# Patient Record
Sex: Male | Born: 1950 | Race: White | Hispanic: No | Marital: Single | Smoking: Former smoker
Health system: Southern US, Community
[De-identification: ages and names within clinical notes are randomized; demographics above are authoritative.]

## PROBLEM LIST (undated history)

## (undated) DIAGNOSIS — G43909 Migraine, unspecified, not intractable, without status migrainosus: Secondary | ICD-10-CM

## (undated) DIAGNOSIS — L709 Acne, unspecified: Secondary | ICD-10-CM

## (undated) DIAGNOSIS — J309 Allergic rhinitis, unspecified: Secondary | ICD-10-CM

## (undated) DIAGNOSIS — K259 Gastric ulcer, unspecified as acute or chronic, without hemorrhage or perforation: Secondary | ICD-10-CM

## (undated) DIAGNOSIS — L719 Rosacea, unspecified: Secondary | ICD-10-CM

## (undated) DIAGNOSIS — E119 Type 2 diabetes mellitus without complications: Secondary | ICD-10-CM

## (undated) DIAGNOSIS — K219 Gastro-esophageal reflux disease without esophagitis: Secondary | ICD-10-CM

## (undated) DIAGNOSIS — K859 Acute pancreatitis without necrosis or infection, unspecified: Secondary | ICD-10-CM

## (undated) DIAGNOSIS — K4090 Unilateral inguinal hernia, without obstruction or gangrene, not specified as recurrent: Secondary | ICD-10-CM

## (undated) DIAGNOSIS — F988 Other specified behavioral and emotional disorders with onset usually occurring in childhood and adolescence: Secondary | ICD-10-CM

## (undated) DIAGNOSIS — F1020 Alcohol dependence, uncomplicated: Secondary | ICD-10-CM

## (undated) HISTORY — DX: Alcohol dependence, uncomplicated: F10.20

## (undated) HISTORY — PX: ESOPHAGOGASTRODUODENOSCOPY: SHX1529

## (undated) HISTORY — PX: TONSILLECTOMY: SUR1361

## (undated) HISTORY — DX: Unilateral inguinal hernia, without obstruction or gangrene, not specified as recurrent: K40.90

## (undated) HISTORY — PX: OTHER SURGICAL HISTORY: SHX169

## (undated) HISTORY — DX: Migraine, unspecified, not intractable, without status migrainosus: G43.909

## (undated) HISTORY — DX: Type 2 diabetes mellitus without complications: E11.9

## (undated) HISTORY — DX: Rosacea, unspecified: L71.9

## (undated) HISTORY — DX: Gastro-esophageal reflux disease without esophagitis: K21.9

## (undated) HISTORY — DX: Allergic rhinitis, unspecified: J30.9

## (undated) HISTORY — DX: Acute pancreatitis without necrosis or infection, unspecified: K85.90

## (undated) HISTORY — DX: Other specified behavioral and emotional disorders with onset usually occurring in childhood and adolescence: F98.8

## (undated) HISTORY — DX: Gastric ulcer, unspecified as acute or chronic, without hemorrhage or perforation: K25.9

## (undated) HISTORY — DX: Acne, unspecified: L70.9

---

## 2007-01-31 ENCOUNTER — Ambulatory Visit: Payer: Self-pay | Admitting: Family Medicine

## 2007-07-20 ENCOUNTER — Ambulatory Visit: Payer: Self-pay | Admitting: Family Medicine

## 2008-05-15 ENCOUNTER — Ambulatory Visit: Payer: Self-pay | Admitting: Family Medicine

## 2009-03-07 ENCOUNTER — Ambulatory Visit: Payer: Self-pay | Admitting: Family Medicine

## 2009-12-30 ENCOUNTER — Ambulatory Visit: Payer: Self-pay | Admitting: Family Medicine

## 2011-02-19 ENCOUNTER — Encounter (INDEPENDENT_AMBULATORY_CARE_PROVIDER_SITE_OTHER): Payer: Self-pay | Admitting: Family Medicine

## 2011-02-19 DIAGNOSIS — F988 Other specified behavioral and emotional disorders with onset usually occurring in childhood and adolescence: Secondary | ICD-10-CM

## 2011-02-19 DIAGNOSIS — L708 Other acne: Secondary | ICD-10-CM

## 2011-02-19 DIAGNOSIS — G43909 Migraine, unspecified, not intractable, without status migrainosus: Secondary | ICD-10-CM

## 2011-02-19 DIAGNOSIS — E785 Hyperlipidemia, unspecified: Secondary | ICD-10-CM

## 2011-05-27 ENCOUNTER — Telehealth: Payer: Self-pay | Admitting: Family Medicine

## 2011-05-27 MED ORDER — AMPHETAMINE-DEXTROAMPHETAMINE 30 MG PO TABS: 30.0000 mg | ORAL_TABLET | Freq: Every day | ORAL | Status: DC

## 2011-05-27 NOTE — Telephone Encounter (Signed)
His Adderall was renewed

## 2011-06-04 ENCOUNTER — Telehealth: Payer: Self-pay | Admitting: Medical

## 2011-06-04 ENCOUNTER — Other Ambulatory Visit: Payer: Self-pay | Admitting: Medical

## 2011-06-04 MED ORDER — AMPHETAMINE-DEXTROAMPHETAMINE 30 MG PO TABS: 30.0000 mg | ORAL_TABLET | Freq: Every day | ORAL | Status: DC

## 2011-06-04 NOTE — Telephone Encounter (Signed)
Pt called yesterday for refill, and apparently script didn't print.  Thus, reprinted script today.

## 2011-06-09 ENCOUNTER — Other Ambulatory Visit: Payer: Self-pay | Admitting: Medical

## 2011-06-09 MED ORDER — AMPHETAMINE-DEXTROAMPHETAMINE 30 MG PO TABS: 30.0000 mg | ORAL_TABLET | Freq: Three times a day (TID) | ORAL | Status: DC

## 2011-10-07 ENCOUNTER — Telehealth: Payer: Self-pay | Admitting: Family Medicine

## 2011-10-07 MED ORDER — AMPHETAMINE-DEXTROAMPHETAMINE 30 MG PO TABS: 30.0000 mg | ORAL_TABLET | Freq: Three times a day (TID) | ORAL | Status: DC

## 2011-10-07 NOTE — Telephone Encounter (Signed)
Adderall renewed.

## 2011-10-07 NOTE — Telephone Encounter (Signed)
Call the patient and let him know the prescription is ready

## 2011-10-07 NOTE — Telephone Encounter (Signed)
Called this # [phone] it has been disconected

## 2012-01-11 ENCOUNTER — Telehealth: Payer: Self-pay | Admitting: Family Medicine

## 2012-01-11 MED ORDER — AMPHETAMINE-DEXTROAMPHETAMINE 30 MG PO TABS: 30.0000 mg | ORAL_TABLET | Freq: Three times a day (TID) | ORAL | Status: DC

## 2012-01-11 NOTE — Telephone Encounter (Signed)
Adderall renewed.

## 2012-05-04 ENCOUNTER — Telehealth: Payer: Self-pay | Admitting: Family Medicine

## 2012-05-04 NOTE — Telephone Encounter (Signed)
Needs refill on Adderal 30 mg #300   Please call when ready

## 2012-05-05 ENCOUNTER — Other Ambulatory Visit: Payer: Self-pay | Admitting: Family Medicine

## 2012-05-05 NOTE — Telephone Encounter (Signed)
Left message pt needs med check

## 2012-05-05 NOTE — Telephone Encounter (Signed)
He needs an appointment

## 2012-05-05 NOTE — Telephone Encounter (Signed)
Is this ok?

## 2012-05-09 ENCOUNTER — Ambulatory Visit (INDEPENDENT_AMBULATORY_CARE_PROVIDER_SITE_OTHER): Payer: Self-pay | Admitting: Family Medicine

## 2012-05-09 ENCOUNTER — Encounter: Payer: Self-pay | Admitting: Family Medicine

## 2012-05-09 VITALS — BP 120/70 | HR 91 | Wt 178.0 lb

## 2012-05-09 DIAGNOSIS — F988 Other specified behavioral and emotional disorders with onset usually occurring in childhood and adolescence: Secondary | ICD-10-CM | POA: Insufficient documentation

## 2012-05-09 DIAGNOSIS — F341 Dysthymic disorder: Secondary | ICD-10-CM

## 2012-05-09 DIAGNOSIS — L259 Unspecified contact dermatitis, unspecified cause: Secondary | ICD-10-CM

## 2012-05-09 DIAGNOSIS — L309 Dermatitis, unspecified: Secondary | ICD-10-CM | POA: Insufficient documentation

## 2012-05-09 DIAGNOSIS — G43909 Migraine, unspecified, not intractable, without status migrainosus: Secondary | ICD-10-CM

## 2012-05-09 MED ORDER — PAROXETINE HCL 30 MG PO TABS: 30.0000 mg | ORAL_TABLET | ORAL | Status: DC

## 2012-05-09 MED ORDER — PROMETHAZINE HCL 25 MG PO TABS: 25.0000 mg | ORAL_TABLET | Freq: Three times a day (TID) | ORAL | Status: DC | PRN

## 2012-05-09 MED ORDER — PROPRANOLOL HCL 80 MG PO TABS: 80.0000 mg | ORAL_TABLET | Freq: Two times a day (BID) | ORAL | Status: DC

## 2012-05-09 MED ORDER — AMPHETAMINE-DEXTROAMPHETAMINE 30 MG PO TABS: 30.0000 mg | ORAL_TABLET | Freq: Three times a day (TID) | ORAL | Status: DC

## 2012-05-09 MED ORDER — AMCINONIDE 0.1 % EX CREA: TOPICAL_CREAM | Freq: Two times a day (BID) | CUTANEOUS | Status: DC

## 2012-05-09 NOTE — Progress Notes (Signed)
  Subjective:    Patient ID: Dakota Gray, male    DOB: February 16, 1951, 61 y.o.   MRN: [id number]  HPI He is here for a medication check. He continues to do well on his Adderall. It lasts roughly 4 or 5 hours. Occasionally he will take a half a dose in the evening to keep from interfering with his sleep. He continues on Paxil and states he is using this to help keep him from drinking. He has been sober for 15 years. He has a history of migraine headaches and continues on Adderall for this. He has roughly 2 per year. He is not interested in stopping. He plans a trip to [place] that would like something for nausea from flying. Would also like a refill on his Cortizone cream that he uses for his dermatitis. He did express some concerns over family stress and dealing with his relatives especially his stepmother.  Review of Systems     Objective:   Physical Exam Alert and in no distress otherwise not examined       Assessment & Plan:   1. ADD (attention deficit disorder)   2. Dysthymia   3. Migraine headache   4. Dermatitis    prescriptions were written for his Adderall, Paxil and Inderal. Also Phenergan for the potential nausea and his Cyclocort which renewed.

## 2012-05-11 ENCOUNTER — Telehealth: Payer: Self-pay | Admitting: Family Medicine

## 2012-05-11 MED ORDER — PAROXETINE HCL 40 MG PO TABS: 40.0000 mg | ORAL_TABLET | ORAL | Status: DC

## 2012-05-11 NOTE — Telephone Encounter (Signed)
Incorrect Paxil dosing was called in. The correct 40 mg prescription was called in

## 2012-05-12 ENCOUNTER — Telehealth: Payer: Self-pay | Admitting: Internal Medicine

## 2012-05-12 MED ORDER — PAROXETINE HCL 40 MG PO TABS: 40.0000 mg | ORAL_TABLET | ORAL | Status: DC

## 2012-05-12 NOTE — Telephone Encounter (Signed)
paxil 40mg  [facility] [address]. Redid it and sent to [facility] [address]

## 2012-05-19 ENCOUNTER — Telehealth: Payer: Self-pay | Admitting: Family Medicine

## 2012-05-21 NOTE — Telephone Encounter (Signed)
Call this in and let him know

## 2012-05-23 ENCOUNTER — Other Ambulatory Visit: Payer: Self-pay

## 2012-05-23 NOTE — Telephone Encounter (Signed)
Called med in and pt also

## 2012-08-01 ENCOUNTER — Telehealth: Payer: Self-pay | Admitting: Internal Medicine

## 2012-08-01 MED ORDER — AMPHETAMINE-DEXTROAMPHETAMINE 30 MG PO TABS: 30.0000 mg | ORAL_TABLET | Freq: Three times a day (TID) | ORAL | Status: DC

## 2012-08-01 NOTE — Telephone Encounter (Signed)
Adderall renewed.

## 2012-11-28 ENCOUNTER — Telehealth: Payer: Self-pay | Admitting: Family Medicine

## 2012-11-28 MED ORDER — AMPHETAMINE-DEXTROAMPHETAMINE 30 MG PO TABS: 30.0000 mg | ORAL_TABLET | Freq: Three times a day (TID) | ORAL | Status: DC

## 2012-11-28 NOTE — Telephone Encounter (Signed)
Adderall renewed.

## 2012-11-29 ENCOUNTER — Telehealth: Payer: Self-pay | Admitting: Family Medicine

## 2012-11-29 NOTE — Telephone Encounter (Signed)
LM

## 2013-03-06 ENCOUNTER — Telehealth: Payer: Self-pay | Admitting: Family Medicine

## 2013-03-06 NOTE — Telephone Encounter (Signed)
Left a message that its time for him to schedule an appt so if he would call back and schedule that.

## 2013-03-06 NOTE — Telephone Encounter (Signed)
Tell him it is time for a visit.

## 2013-03-07 ENCOUNTER — Telehealth: Payer: Self-pay | Admitting: Family Medicine

## 2013-03-07 NOTE — Telephone Encounter (Signed)
Patient states that when he came in last May, that we would not have to come in until a year.  He states that when he comes in to pick up Rx he can schedule an appointment for May   Please call when Rx ready

## 2013-03-09 ENCOUNTER — Other Ambulatory Visit: Payer: Self-pay | Admitting: Family Medicine

## 2013-03-09 MED ORDER — AMPHETAMINE-DEXTROAMPHETAMINE 30 MG PO TABS: 30.0000 mg | ORAL_TABLET | Freq: Three times a day (TID) | ORAL | Status: DC

## 2013-03-09 NOTE — Telephone Encounter (Signed)
Review only

## 2013-04-25 ENCOUNTER — Encounter: Payer: Self-pay | Admitting: Internal Medicine

## 2013-05-08 ENCOUNTER — Encounter: Payer: Self-pay | Admitting: Medical

## 2013-05-08 ENCOUNTER — Ambulatory Visit (INDEPENDENT_AMBULATORY_CARE_PROVIDER_SITE_OTHER): Payer: BC Managed Care – PPO | Admitting: Medical

## 2013-05-08 VITALS — BP 150/100 | HR 88 | Temp 98.1°F | Wt 184.0 lb

## 2013-05-08 DIAGNOSIS — F988 Other specified behavioral and emotional disorders with onset usually occurring in childhood and adolescence: Secondary | ICD-10-CM

## 2013-05-08 DIAGNOSIS — K219 Gastro-esophageal reflux disease without esophagitis: Secondary | ICD-10-CM

## 2013-05-08 DIAGNOSIS — Z23 Encounter for immunization: Secondary | ICD-10-CM

## 2013-05-08 DIAGNOSIS — G43909 Migraine, unspecified, not intractable, without status migrainosus: Secondary | ICD-10-CM

## 2013-05-08 DIAGNOSIS — N529 Male erectile dysfunction, unspecified: Secondary | ICD-10-CM

## 2013-05-08 MED ORDER — OMEPRAZOLE 40 MG PO CPDR: 40.0000 mg | DELAYED_RELEASE_CAPSULE | Freq: Every day | ORAL | Status: DC

## 2013-05-08 MED ORDER — PROPRANOLOL HCL 80 MG PO TABS: 80.0000 mg | ORAL_TABLET | Freq: Two times a day (BID) | ORAL | Status: DC

## 2013-05-08 MED ORDER — TADALAFIL 5 MG PO TABS: 5.0000 mg | ORAL_TABLET | Freq: Every day | ORAL | Status: DC | PRN

## 2013-05-08 MED ORDER — PAROXETINE HCL 40 MG PO TABS: 40.0000 mg | ORAL_TABLET | ORAL | Status: DC

## 2013-05-08 NOTE — Addendum Note (Signed)
Addended by: Janeice Robinson on: 05/08/2013 10:11 AM   Modules accepted: Orders

## 2013-05-08 NOTE — Progress Notes (Signed)
Subjective:  Dakota Gray is a 62 y.o. male who presents for "yearly follow up."  Needs refills on medications.  Normally sees Dr. Susann Givens about once yearly.  Last visit here was 04/2012.   This is my first visit with him today.    He would like a prescription for Zantac 300mg .  Currently taking 150mg  once daily.  He notes hx/o ulcer in the past, 1980s.   He reports heartburn daily, sometimes nausea, sometimes reflux feeling in throat.  He notes hx/o EGD and prior study for Prilosec.    Wants something stronger than 150mg  zantac.   Of note, no prior colonoscopy.   Using Paxil daily.  Paxil keeps him from having desire to drink alcohol, "keeps him sober."  Takes Adderall 3 times daily.  In the past has had therapy.  Usually gets quantity for #300 at a time for 90 day supply.  Apparently there has been pharmacy issues in the past with quantities.  This works ok for him.    Exercises daily, but not eating healthy, no diet discretion.   Ate a whole carton of breyers ice cream the other day.  Lives on 3rd floor, up and down stairs all the time.    Wants refill on Cialis.  Has coupon for the 30 day free trial.   Has been on this in the past.    He declines labs, EKG, and other evaluation today.  Says he doesn't go looking for trouble with testing.  No other aggravating or relieving factors.    No other c/o.  The following portions of the patient's history were reviewed and updated as appropriate: allergies, current medications, past family history, past medical history, past social history, past surgical history and problem list.  ROS Otherwise as in subjective above  Past Medical History  Diagnosis Date  . ADD (attention deficit disorder)   . Acne   . Alcoholic   . Migraine headache   . Allergic rhinitis   . Rosacea     Objective: Physical Exam  Vital signs reviewed  General appearance: alert, no distress, WD/WN Neck: supple, no lymphadenopathy, no thyromegaly, no masses Heart:  RRR, normal S1, S2, no murmurs Lungs: CTA bilaterally, no wheezes, rhonchi, or rales Abdomen: +bs, soft, non tender, non distended, no masses, no hepatomegaly, no splenomegaly Pulses: 2+ radial pulses, 2+ pedal pulses, normal cap refill Ext: no edema   Assessment: Encounter Diagnoses  Name Primary?  . ADD (attention deficit disorder) Yes  . Migraine headache   . GERD (gastroesophageal reflux disease)   . Erectile dysfunction   . Need for Tdap vaccination      Plan: I advised he return soon for fasting physical with Dr. Susann Givens.   Advised baseline EKG as well.   He declines these today.    ADD - i advised that I can't refill 90 day supply of stimulants as a mid level provider, thus, he declines 30 day script today, will f/u with Dr. Susann Givens for scripts.   Migraine - does well on Inderal daily for preventative.  GERD - stop zantac, begin Omeprazole, avoid spicy and acidi foods, caffeine, recheck 72mo.   ED - refilled Cialis.  Recheck 72mo.  tdap vaccine, counseling and VIS given today.  Follow up: 72mo with Dr. Susann Givens.

## 2013-06-01 ENCOUNTER — Encounter: Payer: Self-pay | Admitting: Family Medicine

## 2013-06-04 ENCOUNTER — Telehealth: Payer: Self-pay | Admitting: Internal Medicine

## 2013-06-04 MED ORDER — OMEPRAZOLE 40 MG PO CPDR: 40.0000 mg | DELAYED_RELEASE_CAPSULE | Freq: Every day | ORAL | Status: DC

## 2013-06-04 NOTE — Telephone Encounter (Signed)
done

## 2013-06-05 ENCOUNTER — Encounter: Payer: Self-pay | Admitting: Family Medicine

## 2013-06-25 ENCOUNTER — Other Ambulatory Visit: Payer: Self-pay | Admitting: Family Medicine

## 2013-06-26 MED ORDER — AMPHETAMINE-DEXTROAMPHETAMINE 30 MG PO TABS: 30.0000 mg | ORAL_TABLET | Freq: Three times a day (TID) | ORAL | Status: DC

## 2013-07-08 ENCOUNTER — Other Ambulatory Visit: Payer: Self-pay | Admitting: Family Medicine

## 2013-10-08 ENCOUNTER — Other Ambulatory Visit: Payer: Self-pay | Admitting: Family Medicine

## 2013-10-09 ENCOUNTER — Other Ambulatory Visit: Payer: Self-pay | Admitting: Family Medicine

## 2013-10-09 MED ORDER — AMPHETAMINE-DEXTROAMPHETAMINE 30 MG PO TABS: 30.0000 mg | ORAL_TABLET | Freq: Three times a day (TID) | ORAL | Status: DC

## 2013-10-09 MED ORDER — PROMETHAZINE HCL 25 MG PO TABS: 25.0000 mg | ORAL_TABLET | Freq: Three times a day (TID) | ORAL | Status: DC | PRN

## 2013-10-10 MED ORDER — PROMETHAZINE HCL 25 MG PO TABS: 25.0000 mg | ORAL_TABLET | Freq: Three times a day (TID) | ORAL | Status: DC | PRN

## 2013-10-10 MED ORDER — AMPHETAMINE-DEXTROAMPHETAMINE 30 MG PO TABS: 30.0000 mg | ORAL_TABLET | Freq: Three times a day (TID) | ORAL | Status: DC

## 2013-10-11 ENCOUNTER — Other Ambulatory Visit (INDEPENDENT_AMBULATORY_CARE_PROVIDER_SITE_OTHER): Payer: BC Managed Care – PPO

## 2013-10-11 DIAGNOSIS — Z23 Encounter for immunization: Secondary | ICD-10-CM

## 2013-10-17 ENCOUNTER — Other Ambulatory Visit: Payer: Self-pay

## 2013-12-06 ENCOUNTER — Telehealth: Payer: Self-pay | Admitting: Internal Medicine

## 2013-12-06 ENCOUNTER — Other Ambulatory Visit: Payer: Self-pay

## 2013-12-06 MED ORDER — OMEPRAZOLE 40 MG PO CPDR: 40.0000 mg | DELAYED_RELEASE_CAPSULE | Freq: Every day | ORAL | Status: DC

## 2013-12-06 NOTE — Telephone Encounter (Signed)
DONE

## 2013-12-06 NOTE — Telephone Encounter (Signed)
SENT OMEPRAZOLE IN

## 2013-12-06 NOTE — Telephone Encounter (Signed)
Refill request for omeprazole 40mg  #100 to [facility] order pharmacy in [place], [place]

## 2013-12-27 HISTORY — PX: HEMORRHOID BANDING: SHX5850

## 2014-02-11 ENCOUNTER — Encounter: Payer: Self-pay | Admitting: Family Medicine

## 2014-02-11 ENCOUNTER — Telehealth: Payer: Self-pay | Admitting: Family Medicine

## 2014-02-11 MED ORDER — AMPHETAMINE-DEXTROAMPHETAMINE 30 MG PO TABS: 30.0000 mg | ORAL_TABLET | Freq: Three times a day (TID) | ORAL | Status: DC

## 2014-02-11 NOTE — Telephone Encounter (Signed)
lm

## 2014-03-20 ENCOUNTER — Encounter: Payer: Self-pay | Admitting: Family Medicine

## 2014-03-27 ENCOUNTER — Ambulatory Visit (INDEPENDENT_AMBULATORY_CARE_PROVIDER_SITE_OTHER): Payer: BC Managed Care – PPO | Admitting: Family Medicine

## 2014-03-27 ENCOUNTER — Encounter: Payer: Self-pay | Admitting: Family Medicine

## 2014-03-27 VITALS — BP 100/80 | HR 88 | Wt 176.0 lb

## 2014-03-27 DIAGNOSIS — F988 Other specified behavioral and emotional disorders with onset usually occurring in childhood and adolescence: Secondary | ICD-10-CM

## 2014-03-27 DIAGNOSIS — G43909 Migraine, unspecified, not intractable, without status migrainosus: Secondary | ICD-10-CM

## 2014-03-27 DIAGNOSIS — Z79899 Other long term (current) drug therapy: Secondary | ICD-10-CM

## 2014-03-27 DIAGNOSIS — F341 Dysthymic disorder: Secondary | ICD-10-CM

## 2014-03-27 DIAGNOSIS — R159 Full incontinence of feces: Secondary | ICD-10-CM

## 2014-03-27 LAB — COMPREHENSIVE METABOLIC PANEL
ALT: 21 U/L (ref 0–53)
AST: 25 U/L (ref 0–37)
Albumin: 4.8 g/dL (ref 3.5–5.2)
Alkaline Phosphatase: 71 U/L (ref 39–117)
BILIRUBIN TOTAL: 0.7 mg/dL (ref 0.2–1.2)
BUN: 15 mg/dL (ref 6–23)
CALCIUM: 9.5 mg/dL (ref 8.4–10.5)
CHLORIDE: 99 meq/L (ref 96–112)
CO2: 30 meq/L (ref 19–32)
CREATININE: 1.08 mg/dL (ref 0.50–1.35)
GLUCOSE: 97 mg/dL (ref 70–99)
Potassium: 5 mEq/L (ref 3.5–5.3)
Sodium: 136 mEq/L (ref 135–145)
TOTAL PROTEIN: 6.8 g/dL (ref 6.0–8.3)

## 2014-03-27 LAB — LIPID PANEL
Cholesterol: 234 mg/dL — ABNORMAL HIGH (ref 0–200)
HDL: 44 mg/dL (ref >39)
LDL Cholesterol: 160 mg/dL — ABNORMAL HIGH (ref 0–99)
Total CHOL/HDL Ratio: 5.3 Ratio
Triglycerides: 149 mg/dL (ref <150)
VLDL: 30 mg/dL (ref 0–40)

## 2014-03-27 LAB — CBC WITH DIFFERENTIAL/PLATELET
Basophils Absolute: 0 10*3/uL (ref 0.0–0.1)
Basophils Relative: 0 % (ref 0–1)
EOS ABS: 0.1 10*3/uL (ref 0.0–0.7)
EOS PCT: 2 % (ref 0–5)
HEMATOCRIT: 44.3 % (ref 39.0–52.0)
HEMOGLOBIN: 16.1 g/dL (ref 13.0–17.0)
LYMPHS PCT: 38 % (ref 12–46)
Lymphs Abs: 2.7 10*3/uL (ref 0.7–4.0)
MCH: 29.8 pg (ref 26.0–34.0)
MCHC: 36.3 g/dL — ABNORMAL HIGH (ref 30.0–36.0)
MCV: 82 fL (ref 78.0–100.0)
MONOS PCT: 10 % (ref 3–12)
Monocytes Absolute: 0.7 10*3/uL (ref 0.1–1.0)
Neutro Abs: 3.5 10*3/uL (ref 1.7–7.7)
Neutrophils Relative %: 50 % (ref 43–77)
Platelets: 232 10*3/uL (ref 150–400)
RBC: 5.4 MIL/uL (ref 4.22–5.81)
RDW: 12.9 % (ref 11.5–15.5)
WBC: 7 10*3/uL (ref 4.0–10.5)

## 2014-03-27 NOTE — Progress Notes (Signed)
   Subjective:    Patient ID: Dakota Gray, Dakota Gray    DOB: 1951-08-24, 63 y.o.   MRN: [id number]  HPI He is here for consult concerning bowel habits. Over the last 25 years he has had 6 episodes of losing control of his bowels. Usually they occur while sleeping. He did have one episode of soiling while awake He has never had any soiling during the day other than that one episode. No bladder issues. This is the first time he has mentioned this to any physician. He is not interested in spending a lot of money as he has a high deductible. He continues on Paxil for underlying dysthymia. His Adderall is working well for his ADD. Inderal is controlling his migraines. He is in the process of trying to find a job. He is been living off of his investments for several years but apparently this is about to run out. This has been stressed. He continues to have sleep issues but is comfortable with his present sleeping regimen.   Review of Systems     Objective:   Physical Exam alert and in no distress. Tympanic membranes and canals are normal. Throat is clear. Tonsils are normal. Neck is supple without adenopathy or thyromegaly. Cardiac exam shows a regular sinus rhythm without murmurs or gallops. Lungs are clear to auscultation. Abdominal exam shows no masses or tenderness. Rectal exam deferred to GI        Assessment & Plan:  Incontinence of bowel - Plan: CBC with Differential, Comprehensive metabolic panel, Ambulatory referral to Gastroenterology  ADD (attention deficit disorder)  Dysthymia - Plan: CBC with Differential, Comprehensive metabolic panel  Migraine headache - Plan: CBC with Differential, Comprehensive metabolic panel  Encounter for long-term (current) use of other medications - Plan: CBC with Differential, Comprehensive metabolic panel, Lipid panel  I explained that bowel incontinence issue although rare is out of my area of expertise and he would need referral. I told him I had no clue  as to what it would cost for this. He will continue on his other medications.

## 2014-04-07 ENCOUNTER — Encounter: Payer: Self-pay | Admitting: Family Medicine

## 2014-04-11 ENCOUNTER — Encounter: Payer: Self-pay | Admitting: Family Medicine

## 2014-04-11 ENCOUNTER — Encounter: Payer: Self-pay | Admitting: Internal Medicine

## 2014-05-08 ENCOUNTER — Encounter: Payer: Self-pay | Admitting: Family Medicine

## 2014-05-30 ENCOUNTER — Ambulatory Visit (INDEPENDENT_AMBULATORY_CARE_PROVIDER_SITE_OTHER): Payer: BC Managed Care – PPO | Admitting: Internal Medicine

## 2014-05-30 ENCOUNTER — Encounter: Payer: Self-pay | Admitting: Internal Medicine

## 2014-05-30 VITALS — BP 138/90 | HR 88 | Ht 68.0 in | Wt 175.0 lb

## 2014-05-30 DIAGNOSIS — Z1211 Encounter for screening for malignant neoplasm of colon: Secondary | ICD-10-CM

## 2014-05-30 DIAGNOSIS — K648 Other hemorrhoids: Secondary | ICD-10-CM

## 2014-05-30 DIAGNOSIS — K4090 Unilateral inguinal hernia, without obstruction or gangrene, not specified as recurrent: Secondary | ICD-10-CM

## 2014-05-30 NOTE — Patient Instructions (Signed)
HEMORRHOID BANDING PROCEDURE    FOLLOW-UP CARE   1. The procedure you have had should have been relatively painless since the banding of the area involved does not have nerve endings and there is no pain sensation.  The rubber band cuts off the blood supply to the hemorrhoid and the band may fall off as soon as 48 hours after the banding (the band may occasionally be seen in the toilet bowl following a bowel movement). You may notice a temporary feeling of fullness in the rectum which should respond adequately to plain Tylenol or Motrin.  2. Following the banding, avoid strenuous exercise that evening and resume full activity the next day.  A sitz bath (soaking in a warm tub) or bidet is soothing, and can be useful for cleansing the area after bowel movements.     3. To avoid constipation, take two tablespoons of natural wheat bran, natural oat bran, flax, Benefiber or any over the counter fiber supplement and increase your water intake to 7-8 glasses daily.    4. Unless you have been prescribed anorectal medication, do not put anything inside your rectum for two weeks: No suppositories, enemas, fingers, etc.  5. Occasionally, you may have more bleeding than usual after the banding procedure.  This is often from the untreated hemorrhoids rather than the treated one.  Don't be concerned if there is a tablespoon or so of blood.  If there is more blood than this, lie flat with your bottom higher than your head and apply an ice pack to the area. If the bleeding does not stop within a half an hour or if you feel faint, call our office at [phone] or go to the emergency room.  6. Problems are not common; however, if there is a substantial amount of bleeding, severe pain, chills, fever or difficulty passing urine (very rare) or other problems, you should call us at [phone] or report to the nearest emergency room.  7. Do not stay seated continuously for more than 2-3 hours for a day or two  after the procedure.  Tighten your buttock muscles 10-15 times every two hours and take 10-15 deep breaths every 1-2 hours.  Do not spend more than a few minutes on the toilet if you cannot empty your bowel; instead re-visit the toilet at a later time.  Call back for future bandings.   We will get more information on a colonoscopy and call you back.   I appreciate the opportunity to care for you.

## 2014-05-30 NOTE — Progress Notes (Signed)
Subjective:    Patient ID: Dakota Gray, male    DOB: 1951/08/30, 63 y.o.   MRN: [id number]  HPI Is a 63 year old man here to discuss: Cancer screening, intermittent fecal soilage, and what he thinks is a hernia.  He is a fairly constant urge to defecate or sensation of that any weak small amounts of stool and was underwear. Over the past few years he's had several episodes of nocturnal incontinence as well. No urinary difficulties. He does note he has hemorrhoids and has seen some bright red blood with wiping at times. Sometimes things are [place] sore there are but there is no sharp or severe pain or pain with defecation. He has not necessarily tried any over-the-counter medications.  He also has a bulge in the right inguinal region and wonders if he has a hernia.  He knows colonoscopy for colon cancer screening is appropriate but says he has a very high did not the bowl insurance plan and cannot afford it because he retired at age 11 and because of the stock market crash in 2008 is running out of money and is unable to find work at this time. Allergies  Allergen Reactions  . Other     Artificial Sweeteners (Splenda)   Outpatient Prescriptions Prior to Visit  Medication Sig Dispense Refill  . amcinonide (CYCLOCORT) 0.1 % cream Apply topically 2 (two) times daily.  30 g  1  . amphetamine-dextroamphetamine (ADDERALL) 30 MG tablet Take 1 tablet (30 mg total) by mouth 3 (three) times daily.  300 tablet  0  . omeprazole (PRILOSEC) 40 MG capsule Take 1 capsule (40 mg total) by mouth daily.  100 capsule  1  . promethazine (PHENERGAN) 25 MG tablet Take 1 tablet (25 mg total) by mouth every 8 (eight) hours as needed for nausea.  12 tablet  0  . propranolol (INDERAL) 80 MG tablet Take 1 tablet (80 mg total) by mouth 2 (two) times daily.  200 tablet  3  . tadalafil (CIALIS) 5 MG tablet Take 1 tablet (5 mg total) by mouth daily as needed for erectile dysfunction.  30 tablet  0  . promethazine  (PHENERGAN) 25 MG tablet Take 1 tablet (25 mg total) by mouth every 8 (eight) hours as needed for nausea.  12 tablet  0   No facility-administered medications prior to visit.   Past Medical History  Diagnosis Date  . ADD (attention deficit disorder)   . Acne   . Alcoholic   . Migraine headache   . Allergic rhinitis   . Rosacea   . GERD (gastroesophageal reflux disease)   . Pancreatitis    Past Surgical History  Procedure Laterality Date  . Esophagogastroduodenoscopy      with duodenal ulcer repair  . Tonsillectomy    . Boil extraction      several   History   Social History  . Marital Status: Single    Spouse Name: N/A    Number of Children: 0  . Years of Education: N/A   Occupational History  . retired    Social History Main Topics  . Smoking status: Former Smoker    Types: Cigarettes    Quit date: 10/05/1984  . Smokeless tobacco: Never Used  . Alcohol Use: No  . Drug Use: No  . Sexual Activity: None   Other Topics Concern  . None   Social History Narrative  . None   Family History  Problem Relation Age  of Onset  . Prostate cancer Father   . Lung cancer Paternal Uncle     smoker        Review of Systems As above, also some anxiety and fatigue and allergies. All other review of systems negative or as per history of present illness.    Objective:   Physical Exam General:  Well-developed, well-nourished and in no acute distress Eyes:  anicteric. ENT:   Mouth and posterior pharynx free of lesions.  Neck:   supple w/o thyromegaly or mass.  Lungs: Clear to auscultation bilaterally. Heart:  S1S2, no rubs, murmurs, gallops. Abdomen:  soft, non-tender, no hepatosplenomegaly, or mass and BS+. There is a right inguinal hernia that approaches but does not seem to enter the scrotum, it is reducible. No left-sided hernia. Rectal:  Patti [place] CMA present   Anoderm inspection revealed no abnormalities Anal wink was absent Digital exam revealed normal  resting tone and voluntary squeeze. No mass or rectocele present. Simulated defecation with valsalva revealed appropriate abdominal contraction and descent.    Anoscopy was performed with the patient in the left lateral decubitus position while a chaperone was present and revealed grade 2-3 internal hemorrhoids in all positions   Lymph:  no cervical or supraclavicular adenopathy. Extremities:   no edema Skin   no rash. Neuro:  A&O x 3.  Psych:  appropriate mood and  Affect.   PROCEDURE NOTE: The patient presents with symptomatic grade 2-3 hemorrhoids, requesting rubber band ligation of his/her hemorrhoidal disease.  All risks, benefits and alternative forms of therapy were described and informed consent was obtained.    The decision was made to band the RP and LL internal hemorrhoids, and the [facility] O'Regan System was used to perform band ligation without complication.  Digital anorectal examination was then performed to assure proper positioning of the band, and to adjust the banded tissue as required.  The patient was discharged home without pain or other issues.  Dietary and behavioral recommendations were given and along with follow-up instructions.      The patient will return in if desired for  follow-up and possible additional banding as required. No complications were encountered and the patient tolerated the procedure well.    Lab Results  Component Value Date   WBC 7.0 03/27/2014   HGB 16.1 03/27/2014   HCT 44.3 03/27/2014   MCV 82.0 03/27/2014   PLT 232 03/27/2014     Chemistry      Component Value Date/Time   NA 136 03/27/2014 1419   K 5.0 03/27/2014 1419   CL 99 03/27/2014 1419   CO2 30 03/27/2014 1419   BUN 15 03/27/2014 1419   CREATININE 1.08 03/27/2014 1419      Component Value Date/Time   CALCIUM 9.5 03/27/2014 1419   ALKPHOS 71 03/27/2014 1419   AST 25 03/27/2014 1419   ALT 21 03/27/2014 1419   BILITOT 0.7 03/27/2014 1419          Assessment & Plan:   1. Right inguinal  hernia   2. Internal hemorrhoids with bleeding, prolapse and fecal soiling   3. Special screening for malignant neoplasms, colon    Internal hemorrhoids with bleeding, prolapse and fecal soiling RP and LL columns banded. Is not sure he wants to return due to cost of this procedure.  Right inguinal hernia I discussed with the signs and symptoms of incarceration or and the need to present to the emergency department at that occurred. He is not interested in pursuing  surgical referral at this time.   Screening colonoscopy is recommended. We looked in to cost for him, I thought if he were insured then screening colonoscopy should be no copay or deductible. When we checked it looks like he did not have active insurance. I will notify him and he will decide about following up with this recommendation.  CC: Carollee Herter, MD

## 2014-05-31 ENCOUNTER — Encounter: Payer: Self-pay | Admitting: Internal Medicine

## 2014-05-31 DIAGNOSIS — K4090 Unilateral inguinal hernia, without obstruction or gangrene, not specified as recurrent: Secondary | ICD-10-CM | POA: Insufficient documentation

## 2014-05-31 DIAGNOSIS — K648 Other hemorrhoids: Secondary | ICD-10-CM | POA: Insufficient documentation

## 2014-05-31 NOTE — Assessment & Plan Note (Signed)
RP and LL columns banded. Is not sure he wants to return due to cost of this procedure.

## 2014-05-31 NOTE — Assessment & Plan Note (Signed)
I discussed with the signs and symptoms of incarceration or and the need to present to the emergency department at that occurred. He is not interested in pursuing surgical referral at this time.

## 2014-06-02 ENCOUNTER — Encounter: Payer: Self-pay | Admitting: Internal Medicine

## 2014-06-02 ENCOUNTER — Encounter: Payer: Self-pay | Admitting: Family Medicine

## 2014-06-03 ENCOUNTER — Telehealth: Payer: Self-pay | Admitting: Family Medicine

## 2014-06-03 ENCOUNTER — Other Ambulatory Visit: Payer: Self-pay | Admitting: Family Medicine

## 2014-06-03 MED ORDER — AMPHETAMINE-DEXTROAMPHETAMINE 30 MG PO TABS: 30.0000 mg | ORAL_TABLET | Freq: Three times a day (TID) | ORAL | Status: DC

## 2014-06-03 NOTE — Telephone Encounter (Signed)
lm

## 2014-06-11 ENCOUNTER — Encounter: Payer: Self-pay | Admitting: Internal Medicine

## 2014-06-11 ENCOUNTER — Encounter: Payer: Self-pay | Admitting: Family Medicine

## 2014-06-12 ENCOUNTER — Encounter: Payer: Self-pay | Admitting: Family Medicine

## 2014-07-26 ENCOUNTER — Other Ambulatory Visit: Payer: Self-pay | Admitting: Medical

## 2014-07-29 NOTE — Telephone Encounter (Signed)
Is this okay to refill? 

## 2014-08-10 ENCOUNTER — Other Ambulatory Visit: Payer: Self-pay | Admitting: Family Medicine

## 2014-08-12 ENCOUNTER — Encounter: Payer: Self-pay | Admitting: Internal Medicine

## 2014-08-12 ENCOUNTER — Ambulatory Visit (INDEPENDENT_AMBULATORY_CARE_PROVIDER_SITE_OTHER): Payer: BC Managed Care – PPO | Admitting: Internal Medicine

## 2014-08-12 VITALS — BP 110/66 | HR 68 | Ht 68.0 in | Wt 177.4 lb

## 2014-08-12 DIAGNOSIS — K648 Other hemorrhoids: Secondary | ICD-10-CM

## 2014-08-12 DIAGNOSIS — Z1211 Encounter for screening for malignant neoplasm of colon: Secondary | ICD-10-CM

## 2014-08-12 MED ORDER — POLYETHYLENE GLYCOL 3350 17 GM/SCOOP PO POWD: 1.0000 | Freq: Every day | ORAL | Status: DC

## 2014-08-12 NOTE — Patient Instructions (Addendum)
HEMORRHOID BANDING PROCEDURE    FOLLOW-UP CARE   1. The procedure you have had should have been relatively painless since the banding of the area involved does not have nerve endings and there is no pain sensation.  The rubber band cuts off the blood supply to the hemorrhoid and the band may fall off as soon as 48 hours after the banding (the band may occasionally be seen in the toilet bowl following a bowel movement). You may notice a temporary feeling of fullness in the rectum which should respond adequately to plain Tylenol or Motrin.  2. Following the banding, avoid strenuous exercise that evening and resume full activity the next day.  A sitz bath (soaking in a warm tub) or bidet is soothing, and can be useful for cleansing the area after bowel movements.     3. To avoid constipation, take two tablespoons of natural wheat bran, natural oat bran, flax, Benefiber or any over the counter fiber supplement and increase your water intake to 7-8 glasses daily.    4. Unless you have been prescribed anorectal medication, do not put anything inside your rectum for two weeks: No suppositories, enemas, fingers, etc.  5. Occasionally, you may have more bleeding than usual after the banding procedure.  This is often from the untreated hemorrhoids rather than the treated one.  Don't be concerned if there is a tablespoon or so of blood.  If there is more blood than this, lie flat with your bottom higher than your head and apply an ice pack to the area. If the bleeding does not stop within a half an hour or if you feel faint, call our office at [phone] or go to the emergency room.  6. Problems are not common; however, if there is a substantial amount of bleeding, severe pain, chills, fever or difficulty passing urine (very rare) or other problems, you should call us at [phone] or report to the nearest emergency room.  7. Do not stay seated continuously for more than 2-3 hours for a day or two  after the procedure.  Tighten your buttock muscles 10-15 times every two hours and take 10-15 deep breaths every 1-2 hours.  Do not spend more than a few minutes on the toilet if you cannot empty your bowel; instead re-visit the toilet at a later time.   You have been scheduled for a colonoscopy. Please follow written instructions given to you at your visit today.  Please pick up your prep supplies at the pharmacy. If you use inhalers (even only as needed), please bring them with you on the day of your procedure. Your physician has requested that you go to www.[email or web address] and enter the access code given to you at your visit today. This web site gives a general overview about your procedure. However, you should still follow specific instructions given to you by our office regarding your preparation for the procedure.  I appreciate the opportunity to care for you.

## 2014-08-12 NOTE — Progress Notes (Signed)
Patient ID: Dakota Gray, male   DOB: June 11, 1951, 63 y.o.   MRN: [id number]        PROCEDURE NOTE: The patient presents with symptomatic grade 2-3  hemorrhoids, requesting rubber band ligation of his/her hemorrhoidal disease.  All risks, benefits and alternative forms of therapy were described and informed consent was obtained.   The anorectum was pre-medicated with 5% lidocaine cream and 0.125% NTG ointment. The decision was made to band the RA internal hemorrhoid, and the [facility] O'Regan System was used to perform band ligation without complication.  Digital anorectal examination was then performed to assure proper positioning of the band, and to adjust the banded tissue as required.  The patient was discharged home without pain or other issues.  Dietary and behavioral recommendations were given and along with follow-up instructions.     The following adjunctive treatments were recommended:  Fiber supplements  The patient will return for screening colonoscopy and we will reassess then.    He was asking if I would Rx oxycodone prn Headache and trazadone qhs - I advised he should seek headache specialist for evaluation and treatment.

## 2014-08-12 NOTE — Assessment & Plan Note (Addendum)
RA column banded today

## 2014-08-12 NOTE — Telephone Encounter (Signed)
Dr.lalonde is this okay his last appointment 4/15

## 2014-08-13 ENCOUNTER — Other Ambulatory Visit: Payer: Self-pay | Admitting: Family Medicine

## 2014-08-15 ENCOUNTER — Encounter: Payer: Self-pay | Admitting: Internal Medicine

## 2014-08-22 ENCOUNTER — Encounter: Payer: Self-pay | Admitting: Family Medicine

## 2014-08-24 ENCOUNTER — Other Ambulatory Visit: Payer: Self-pay | Admitting: Family Medicine

## 2014-08-24 MED ORDER — PAROXETINE HCL 40 MG PO TABS: ORAL_TABLET | ORAL | Status: DC

## 2014-08-26 ENCOUNTER — Other Ambulatory Visit: Payer: Self-pay | Admitting: Family Medicine

## 2014-08-26 MED ORDER — PAROXETINE HCL 40 MG PO TABS: ORAL_TABLET | ORAL | Status: DC

## 2014-09-18 ENCOUNTER — Other Ambulatory Visit: Payer: Self-pay | Admitting: Family Medicine

## 2014-09-18 MED ORDER — AMPHETAMINE-DEXTROAMPHETAMINE 30 MG PO TABS: 30.0000 mg | ORAL_TABLET | Freq: Three times a day (TID) | ORAL | Status: DC

## 2014-09-19 ENCOUNTER — Telehealth: Payer: Self-pay | Admitting: Family Medicine

## 2014-09-19 NOTE — Telephone Encounter (Signed)
Called pt reached vm rx ready to pick up

## 2014-10-16 ENCOUNTER — Encounter: Payer: BC Managed Care – PPO | Admitting: Internal Medicine

## 2014-11-18 ENCOUNTER — Encounter: Payer: Self-pay | Admitting: Internal Medicine

## 2014-11-25 ENCOUNTER — Other Ambulatory Visit: Payer: Self-pay | Admitting: Family Medicine

## 2014-11-26 NOTE — Telephone Encounter (Signed)
Dr.Lalonde is this okay 

## 2015-01-14 ENCOUNTER — Telehealth: Payer: Self-pay | Admitting: Family Medicine

## 2015-01-14 ENCOUNTER — Encounter: Payer: Self-pay | Admitting: Internal Medicine

## 2015-01-14 ENCOUNTER — Other Ambulatory Visit: Payer: Self-pay | Admitting: Family Medicine

## 2015-01-14 MED ORDER — AMPHETAMINE-DEXTROAMPHETAMINE 30 MG PO TABS: 30.0000 mg | ORAL_TABLET | Freq: Three times a day (TID) | ORAL | Status: DC

## 2015-01-14 NOTE — Telephone Encounter (Signed)
Left message to inform rx is ready.

## 2015-03-11 ENCOUNTER — Telehealth: Payer: Self-pay | Admitting: Family Medicine

## 2015-03-11 ENCOUNTER — Encounter: Payer: Self-pay | Admitting: Family Medicine

## 2015-03-11 ENCOUNTER — Other Ambulatory Visit: Payer: Self-pay | Admitting: Family Medicine

## 2015-03-11 DIAGNOSIS — K4090 Unilateral inguinal hernia, without obstruction or gangrene, not specified as recurrent: Secondary | ICD-10-CM

## 2015-03-11 MED ORDER — AMPHETAMINE-DEXTROAMPHET ER 30 MG PO CP24: 30.0000 mg | ORAL_CAPSULE | ORAL | Status: DC

## 2015-03-11 NOTE — Progress Notes (Signed)
He is apparently having difficulty getting the regular Adderall 30 mg. I will given the Adderall XR 30. He will be seen by me in a few days.

## 2015-03-11 NOTE — Telephone Encounter (Signed)
L/m for pt. rx is ready for pick up

## 2015-03-14 ENCOUNTER — Telehealth: Payer: Self-pay | Admitting: Family Medicine

## 2015-03-14 NOTE — Telephone Encounter (Signed)
Recv'd denial for generic, only covers BRAND Adderall XR unless pt has tried and failed.  Called pharmacy and went thru ins for $40 co pay. Left message for pt.

## 2015-03-18 ENCOUNTER — Ambulatory Visit (INDEPENDENT_AMBULATORY_CARE_PROVIDER_SITE_OTHER): Payer: 59 | Admitting: Family Medicine

## 2015-03-18 VITALS — BP 120/82 | HR 89 | Wt 178.2 lb

## 2015-03-18 DIAGNOSIS — F909 Attention-deficit hyperactivity disorder, unspecified type: Secondary | ICD-10-CM

## 2015-03-18 DIAGNOSIS — R631 Polydipsia: Secondary | ICD-10-CM | POA: Diagnosis not present

## 2015-03-18 DIAGNOSIS — F988 Other specified behavioral and emotional disorders with onset usually occurring in childhood and adolescence: Secondary | ICD-10-CM

## 2015-03-18 DIAGNOSIS — R61 Generalized hyperhidrosis: Secondary | ICD-10-CM

## 2015-03-18 LAB — POCT URINALYSIS DIPSTICK
Bilirubin, UA: NEGATIVE
Blood, UA: NEGATIVE
GLUCOSE UA: NEGATIVE
Ketones, UA: NEGATIVE
Leukocytes, UA: NEGATIVE
Nitrite, UA: NEGATIVE
Protein, UA: NEGATIVE
SPEC GRAV UA: 1.025
Urobilinogen, UA: NEGATIVE
pH, UA: 6

## 2015-03-18 MED ORDER — AMPHETAMINE-DEXTROAMPHETAMINE 20 MG PO TABS: ORAL_TABLET | ORAL | Status: DC

## 2015-03-18 NOTE — Progress Notes (Signed)
   Subjective:    Patient ID: Dakota Gray, male    DOB: Jan 14, 1951, 64 y.o.   MRN: [id number]  HPI He is here for multiple issues. He has had difficulty in the past with internal hemorrhoids and did have banding. He was sent there initially because of difficulty with soiling. He states that the banding of the hemorrhoids is really not helped. He then when on to explain that he has had 2 separate incidents of soiling while laying in bed at night. He also had one incident of swelling after urinating. He states that after that he decided to always sit down to urinate to lunate any possibility of problems. These occurred over the last 6-8 months. He feels as if the other procedure was not worthwhile. He also stated that he is always thirsty. He urinates several times during the day and also at night. He has had difficulty with night sweats for several years. He continues on medications listed in the chart including Paxil. He has had difficulty with his ADD medication. Apparently the 30 mg pills are not in stock. He did try Adderall XR early milligrams but states it only lasted 6 hours. He then when on to explain that on occasion he would "pass out" roughly 6 hours after taking his Adderall XR. He also complains of occasional difficulty with the left second third and fourth toes becoming quite red cold and painful. This could last for several hours or even less. It did not occur every day.   Review of Systems     Objective:   Physical Exam Alert and in no distress. Tympanic membranes and canals are normal. Pharyngeal area is normal. Neck is supple without adenopathy or thyromegaly. Cardiac exam shows a regular sinus rhythm without murmurs or gallops. Lungs are clear to auscultation. Exam of his toes shows normal sensation and skin with good pulses.       Assessment & Plan:  ADD (attention deficit disorder)  Always thirsty - Plan: POCT urinalysis dipstick  Night sweats Over 45 minutes spent  discussing all these issues with him. He was very difficult to get a good history from him as he tended to give obtuse answers. I did recommend that he keep track of his soiling and see if this reoccurs or if we might possibly need to refer to a different GI doctor since he was not happy with the one he is originally seen. I will give him Adderall 20 mg to be taken one and 3:30 times per day which is roughly what he was taking before. Also this will be covered with less money out of his pocket. Explained that he is drinking adequate amounts of water but can certainly drink more if he likes. No therapy for the night sweats as this is probably related to his Paxil.

## 2015-03-27 ENCOUNTER — Encounter: Payer: Self-pay | Admitting: Family Medicine

## 2015-03-28 ENCOUNTER — Telehealth: Payer: Self-pay | Admitting: Family Medicine

## 2015-03-28 NOTE — Telephone Encounter (Signed)
Called [facility] Generic Adderall 20mg  1 1/2 tabs qd #135/30 approved.  But when test claim was ran it rejected due to refill too soon.  Called pharmacy help desk at # [phone] and explained what happened & they did over-ride.  Called pharmacy & it went thru for $10

## 2015-04-20 ENCOUNTER — Encounter: Payer: Self-pay | Admitting: Family Medicine

## 2015-04-22 ENCOUNTER — Other Ambulatory Visit: Payer: Self-pay | Admitting: Family Medicine

## 2015-04-22 MED ORDER — AMPHETAMINE-DEXTROAMPHETAMINE 30 MG PO TABS: 30.0000 mg | ORAL_TABLET | Freq: Every day | ORAL | Status: DC

## 2015-04-22 MED ORDER — AMPHETAMINE-DEXTROAMPHETAMINE 30 MG PO TABS: 30.0000 mg | ORAL_TABLET | Freq: Three times a day (TID) | ORAL | Status: DC

## 2015-04-22 NOTE — Progress Notes (Signed)
Prescriptions were written for 3 months. A small prescription for 15 tablets was given. He would like to try a different brand name

## 2015-04-23 ENCOUNTER — Encounter: Payer: Self-pay | Admitting: Internal Medicine

## 2015-04-27 ENCOUNTER — Encounter: Payer: Self-pay | Admitting: Family Medicine

## 2015-04-28 ENCOUNTER — Other Ambulatory Visit: Payer: Self-pay | Admitting: Family Medicine

## 2015-05-12 ENCOUNTER — Telehealth: Payer: Self-pay | Admitting: Family Medicine

## 2015-05-12 NOTE — Telephone Encounter (Signed)
Left message for pt to call. Per JCL needs an appt.  °

## 2015-05-17 ENCOUNTER — Encounter: Payer: Self-pay | Admitting: Family Medicine

## 2015-05-20 ENCOUNTER — Encounter: Payer: Self-pay | Admitting: Family Medicine

## 2015-05-29 ENCOUNTER — Telehealth: Payer: Self-pay | Admitting: Family Medicine

## 2015-05-29 NOTE — Telephone Encounter (Signed)
Please go ahead with referral for patient rt inquinal hernia repair per Dr. Susann GivensLalonde

## 2015-06-02 NOTE — Telephone Encounter (Signed)
I have sent a referral to [facility] [phone] fax [phone]

## 2015-06-12 ENCOUNTER — Telehealth: Payer: Self-pay | Admitting: Internal Medicine

## 2015-06-12 ENCOUNTER — Other Ambulatory Visit: Payer: Self-pay

## 2015-06-12 MED ORDER — PROPRANOLOL HCL 80 MG PO TABS: ORAL_TABLET | ORAL | Status: DC

## 2015-06-12 NOTE — Telephone Encounter (Signed)
CALLED IN 

## 2015-06-12 NOTE — Telephone Encounter (Signed)
Refill request for propranolol 80mg  #180 1 BID to [facility] [address]

## 2015-08-14 ENCOUNTER — Other Ambulatory Visit: Payer: Self-pay | Admitting: Family Medicine

## 2015-08-14 ENCOUNTER — Encounter: Payer: Self-pay | Admitting: Family Medicine

## 2015-08-14 ENCOUNTER — Telehealth: Payer: Self-pay

## 2015-08-14 ENCOUNTER — Encounter: Payer: Self-pay | Admitting: Internal Medicine

## 2015-08-14 DIAGNOSIS — Z79899 Other long term (current) drug therapy: Secondary | ICD-10-CM

## 2015-08-14 DIAGNOSIS — G43009 Migraine without aura, not intractable, without status migrainosus: Secondary | ICD-10-CM

## 2015-08-14 DIAGNOSIS — F341 Dysthymic disorder: Secondary | ICD-10-CM

## 2015-08-14 DIAGNOSIS — F988 Other specified behavioral and emotional disorders with onset usually occurring in childhood and adolescence: Secondary | ICD-10-CM

## 2015-08-14 MED ORDER — AMPHETAMINE-DEXTROAMPHETAMINE 30 MG PO TABS: 30.0000 mg | ORAL_TABLET | Freq: Three times a day (TID) | ORAL | Status: DC

## 2015-08-14 MED ORDER — PROPRANOLOL HCL 80 MG PO TABS: ORAL_TABLET | ORAL | Status: DC

## 2015-08-14 NOTE — Telephone Encounter (Signed)
Dakota Gray with [facility] called and stated pt also needs a refill of propranolol 80 mg.

## 2015-08-14 NOTE — Telephone Encounter (Signed)
Left a message for pt. To pickup his Adderall prescriptions.

## 2015-08-14 NOTE — Telephone Encounter (Signed)
Is this okay?

## 2015-08-14 NOTE — Addendum Note (Signed)
Addended by: Ronnald Nian on: 08/14/2015 03:18 PM   Modules accepted: Orders

## 2015-10-12 ENCOUNTER — Encounter: Payer: Self-pay | Admitting: Family Medicine

## 2015-10-14 ENCOUNTER — Encounter: Payer: Self-pay | Admitting: Family Medicine

## 2015-10-15 ENCOUNTER — Ambulatory Visit: Payer: 59 | Admitting: Family Medicine

## 2015-10-16 ENCOUNTER — Other Ambulatory Visit: Payer: Self-pay

## 2015-10-16 ENCOUNTER — Ambulatory Visit (INDEPENDENT_AMBULATORY_CARE_PROVIDER_SITE_OTHER): Payer: 59 | Admitting: Family Medicine

## 2015-10-16 ENCOUNTER — Encounter: Payer: Self-pay | Admitting: Family Medicine

## 2015-10-16 VITALS — BP 116/78 | HR 84 | Ht 69.0 in | Wt 179.0 lb

## 2015-10-16 DIAGNOSIS — F988 Other specified behavioral and emotional disorders with onset usually occurring in childhood and adolescence: Secondary | ICD-10-CM

## 2015-10-16 DIAGNOSIS — M542 Cervicalgia: Secondary | ICD-10-CM | POA: Diagnosis not present

## 2015-10-16 DIAGNOSIS — Z23 Encounter for immunization: Secondary | ICD-10-CM | POA: Diagnosis not present

## 2015-10-16 DIAGNOSIS — G43001 Migraine without aura, not intractable, with status migrainosus: Secondary | ICD-10-CM | POA: Diagnosis not present

## 2015-10-16 DIAGNOSIS — E875 Hyperkalemia: Secondary | ICD-10-CM

## 2015-10-16 DIAGNOSIS — Z79899 Other long term (current) drug therapy: Secondary | ICD-10-CM | POA: Diagnosis not present

## 2015-10-16 DIAGNOSIS — Z1159 Encounter for screening for other viral diseases: Secondary | ICD-10-CM | POA: Diagnosis not present

## 2015-10-16 DIAGNOSIS — E785 Hyperlipidemia, unspecified: Secondary | ICD-10-CM | POA: Diagnosis not present

## 2015-10-16 DIAGNOSIS — F909 Attention-deficit hyperactivity disorder, unspecified type: Secondary | ICD-10-CM

## 2015-10-16 DIAGNOSIS — F341 Dysthymic disorder: Secondary | ICD-10-CM | POA: Diagnosis not present

## 2015-10-16 LAB — CBC WITH DIFFERENTIAL/PLATELET
Basophils Absolute: 0.1 10*3/uL (ref 0.0–0.1)
Basophils Relative: 1 % (ref 0–1)
Eosinophils Absolute: 0.1 10*3/uL (ref 0.0–0.7)
Eosinophils Relative: 2 % (ref 0–5)
HEMATOCRIT: 44.6 % (ref 39.0–52.0)
HEMOGLOBIN: 15.3 g/dL (ref 13.0–17.0)
LYMPHS ABS: 2.2 10*3/uL (ref 0.7–4.0)
LYMPHS PCT: 33 % (ref 12–46)
MCH: 29.7 pg (ref 26.0–34.0)
MCHC: 34.3 g/dL (ref 30.0–36.0)
MCV: 86.6 fL (ref 78.0–100.0)
MONOS PCT: 10 % (ref 3–12)
MPV: 9.6 fL (ref 8.6–12.4)
Monocytes Absolute: 0.7 10*3/uL (ref 0.1–1.0)
NEUTROS ABS: 3.6 10*3/uL (ref 1.7–7.7)
NEUTROS PCT: 54 % (ref 43–77)
PLATELETS: 237 10*3/uL (ref 150–400)
RBC: 5.15 MIL/uL (ref 4.22–5.81)
RDW: 12.8 % (ref 11.5–15.5)
WBC: 6.6 10*3/uL (ref 4.0–10.5)

## 2015-10-16 LAB — LIPID PANEL
CHOL/HDL RATIO: 6 ratio — AB (ref <=5.0)
CHOLESTEROL: 221 mg/dL — AB (ref 125–200)
HDL: 37 mg/dL — ABNORMAL LOW (ref >=40)
LDL Cholesterol: 148 mg/dL — ABNORMAL HIGH (ref <130)
TRIGLYCERIDES: 182 mg/dL — AB (ref <150)
VLDL: 36 mg/dL — ABNORMAL HIGH (ref <30)

## 2015-10-16 LAB — COMPREHENSIVE METABOLIC PANEL
ALBUMIN: 4.5 g/dL (ref 3.6–5.1)
ALK PHOS: 74 U/L (ref 40–115)
ALT: 16 U/L (ref 9–46)
AST: 19 U/L (ref 10–35)
BUN: 14 mg/dL (ref 7–25)
CALCIUM: 9.9 mg/dL (ref 8.6–10.3)
CO2: 28 mmol/L (ref 20–31)
Chloride: 102 mmol/L (ref 98–110)
Creat: 0.92 mg/dL (ref 0.70–1.25)
Glucose, Bld: 103 mg/dL — ABNORMAL HIGH (ref 65–99)
POTASSIUM: 5.5 mmol/L — AB (ref 3.5–5.3)
Sodium: 138 mmol/L (ref 135–146)
TOTAL PROTEIN: 7 g/dL (ref 6.1–8.1)
Total Bilirubin: 0.6 mg/dL (ref 0.2–1.2)

## 2015-10-16 MED ORDER — AMPHETAMINE-DEXTROAMPHETAMINE 30 MG PO TABS: 30.0000 mg | ORAL_TABLET | Freq: Three times a day (TID) | ORAL | Status: DC

## 2015-10-16 MED ORDER — PROMETHAZINE HCL 25 MG PO TABS: 25.0000 mg | ORAL_TABLET | Freq: Three times a day (TID) | ORAL | Status: DC | PRN

## 2015-10-16 NOTE — Progress Notes (Signed)
   Subjective:    Patient ID: Dakota Gray, male    DOB: 07/15/1951, 64 y.o.   MRN: [id number]  HPI He is here for an interval evaluation. He has concerns over his immunizations. He also has concerns over getting a colonoscopy which he has not had.he is doing well on his present ADD medication. He takes it 3 times a day which helps him stay focused and on task. He would like to continue on this regimen. He has been stable on this for quite some time. He also uses Phenergan intermittently for his migraines and also if he travels he does get nauseated easily. He also complains of a one-month history of right-sided neck pain that did radiate into the shoulder. The pain is made worse with rotation. He has been taking heat and stretching as well as 800 mg of ibuprofen per day but no improvement in his symptoms. No numbness, tingling or weakness other than he says in his fingers. Psychologically he is stable and has no particular concerns. Review of his record does show some evidence of elevated lipids. Otherwise he has no particular concerns or complaints.   Review of Systems     Objective:   Physical Exam Alert and in no distress. Limited range of motion in all directions with his neck. Normal motor, sensory and DTRs of his arms.      Assessment & Plan:  Need for prophylactic vaccination and inoculation against influenza - Plan: Flu Vaccine QUAD 36+ mos IM  Hyperlipidemia LDL goal <130 - Plan: Lipid panel  ADD (attention deficit disorder) - Plan: amphetamine-dextroamphetamine (ADDERALL) 30 MG tablet, amphetamine-dextroamphetamine (ADDERALL) 30 MG tablet, amphetamine-dextroamphetamine (ADDERALL) 30 MG tablet  Neck pain - Plan: CBC with Differential/Platelet, Comprehensive metabolic panel, DG Cervical Spine Complete  Migraine without aura and with status migrainosus, not intractable - Plan: CBC with Differential/Platelet, Comprehensive metabolic panel, promethazine (PHENERGAN) 25 MG  tablet  Dysthymia - Plan: CBC with Differential/Platelet, Comprehensive metabolic panel  Need for hepatitis C screening test - Plan: Hepatitis C Antibody, CANCELED: Hepatitis C Antibody  Encounter for long-term (current) use of medications - Plan: CBC with Differential/Platelet, Comprehensive metabolic panel, Lipid panel I will do routine blood screening for general purposes and also to look at his lipids as well as hepatitis C since he is in the age range. His migraines are not giving him much difficulty. Psychologically again he is doing well. Discussed the neck pain with him and if x-rays are negative, we'll send him for physical therapy. His Adderall was renewed as well as his Phenergan.

## 2015-10-17 LAB — HEPATITIS C ANTIBODY: HCV Ab: NEGATIVE

## 2015-10-17 NOTE — Addendum Note (Signed)
Addended by: Ronnald NianLALONDE, Brandley Aldrete C on: 10/17/2015 10:10 AM   Modules accepted: Orders

## 2015-10-18 ENCOUNTER — Encounter: Payer: Self-pay | Admitting: Family Medicine

## 2015-10-20 ENCOUNTER — Ambulatory Visit
Admission: RE | Admit: 2015-10-20 | Discharge: 2015-10-20 | Disposition: A | Discharge status: Home / Self Care | Payer: 59 | Source: Ambulatory Visit | Attending: Family Medicine | Admitting: Family Medicine

## 2015-10-20 DIAGNOSIS — M542 Cervicalgia: Secondary | ICD-10-CM

## 2015-10-21 ENCOUNTER — Other Ambulatory Visit: Payer: Self-pay

## 2015-10-21 DIAGNOSIS — M542 Cervicalgia: Secondary | ICD-10-CM

## 2015-10-23 ENCOUNTER — Encounter: Payer: Self-pay | Admitting: Family Medicine

## 2015-10-29 ENCOUNTER — Other Ambulatory Visit: Payer: Self-pay | Admitting: Family Medicine

## 2015-10-29 ENCOUNTER — Encounter: Payer: Self-pay | Admitting: Family Medicine

## 2015-10-29 MED ORDER — SUMATRIPTAN-NAPROXEN SODIUM 85-500 MG PO TABS: 1.0000 | ORAL_TABLET | ORAL | Status: DC | PRN

## 2015-10-31 ENCOUNTER — Encounter: Payer: Self-pay | Admitting: Family Medicine

## 2015-11-11 ENCOUNTER — Encounter: Payer: Self-pay | Admitting: Family Medicine

## 2015-11-12 ENCOUNTER — Other Ambulatory Visit: Payer: Self-pay | Admitting: Family Medicine

## 2015-11-12 MED ORDER — SUMATRIPTAN SUCCINATE 100 MG PO TABS
100.0000 mg | ORAL_TABLET | Freq: Once | ORAL | Status: DC
Start: 2015-11-12 — End: 2017-11-24

## 2015-11-24 ENCOUNTER — Telehealth: Payer: Self-pay | Admitting: Family Medicine

## 2015-11-24 NOTE — Telephone Encounter (Signed)
P.A. TREXIMET

## 2015-11-25 ENCOUNTER — Encounter: Payer: Self-pay | Admitting: Family Medicine

## 2015-11-25 ENCOUNTER — Other Ambulatory Visit: Payer: Self-pay | Admitting: Family Medicine

## 2015-11-25 MED ORDER — VARDENAFIL HCL 20 MG PO TABS: 20.0000 mg | ORAL_TABLET | Freq: Every day | ORAL | Status: DC | PRN

## 2015-12-09 NOTE — Telephone Encounter (Signed)
P.A. Colleen Canreximet denied, pt needs trial of ALL of the following Naratriptan, Rizatriptan, Sumatriptan, Zolmitriptan, all used in combination with naproxen Do you want to switch

## 2015-12-25 ENCOUNTER — Telehealth: Payer: Self-pay | Admitting: Family Medicine

## 2015-12-25 NOTE — Telephone Encounter (Signed)
P.A. LEVITRA

## 2015-12-25 NOTE — Telephone Encounter (Signed)
Pt was switched

## 2015-12-25 NOTE — Telephone Encounter (Signed)
P.A. Denied, called insurance company and ED meds are plan exclusion Called pt & advised option of Pt Assistance or pay out of pocket with [facility] in [place].  He is going to apply for Medicaid  And also Pt Assistance

## 2016-01-01 ENCOUNTER — Encounter: Payer: Self-pay | Admitting: Family Medicine

## 2016-01-01 ENCOUNTER — Other Ambulatory Visit: Payer: Self-pay | Admitting: Family Medicine

## 2016-02-15 ENCOUNTER — Other Ambulatory Visit: Payer: Self-pay | Admitting: Family Medicine

## 2016-02-16 ENCOUNTER — Encounter: Payer: Self-pay | Admitting: Family Medicine

## 2016-02-16 ENCOUNTER — Other Ambulatory Visit: Payer: Self-pay | Admitting: Family Medicine

## 2016-02-16 DIAGNOSIS — F988 Other specified behavioral and emotional disorders with onset usually occurring in childhood and adolescence: Secondary | ICD-10-CM

## 2016-02-16 MED ORDER — AMPHETAMINE-DEXTROAMPHETAMINE 30 MG PO TABS: 30.0000 mg | ORAL_TABLET | Freq: Three times a day (TID) | ORAL | Status: DC

## 2016-05-13 ENCOUNTER — Other Ambulatory Visit: Payer: Self-pay | Admitting: Family Medicine

## 2016-05-13 DIAGNOSIS — F988 Other specified behavioral and emotional disorders with onset usually occurring in childhood and adolescence: Secondary | ICD-10-CM

## 2016-05-13 MED ORDER — AMPHETAMINE-DEXTROAMPHETAMINE 30 MG PO TABS: 30.0000 mg | ORAL_TABLET | Freq: Three times a day (TID) | ORAL | Status: DC

## 2016-05-13 NOTE — Addendum Note (Signed)
Addended by: Ronnald NianLALONDE, Pranish Akhavan C on: 05/13/2016 08:23 AM   Modules accepted: Orders

## 2016-05-20 ENCOUNTER — Other Ambulatory Visit: Payer: Self-pay

## 2016-05-20 MED ORDER — IBUPROFEN 800 MG PO TABS: 800.0000 mg | ORAL_TABLET | Freq: Three times a day (TID) | ORAL | Status: DC | PRN

## 2016-05-20 NOTE — Telephone Encounter (Signed)
[facility] per [employer]

## 2016-05-21 ENCOUNTER — Telehealth: Payer: Self-pay

## 2016-05-21 MED ORDER — PAROXETINE HCL 40 MG PO TABS: 40.0000 mg | ORAL_TABLET | Freq: Every morning | ORAL | Status: DC

## 2016-05-21 NOTE — Telephone Encounter (Signed)
Fax rcvd requesting 90 day [facility] Trixie Rude/RLB

## 2016-05-30 ENCOUNTER — Encounter: Payer: Self-pay | Admitting: Family Medicine

## 2016-06-11 ENCOUNTER — Telehealth: Payer: Self-pay

## 2016-06-11 NOTE — Telephone Encounter (Signed)
Records from 12/27/2014 to [place] [facility] [facility] [facility] [place]. Trixie Rude/RLB

## 2016-08-16 ENCOUNTER — Other Ambulatory Visit: Payer: Self-pay | Admitting: Family Medicine

## 2016-08-16 ENCOUNTER — Encounter: Payer: Self-pay | Admitting: Family Medicine

## 2016-08-16 DIAGNOSIS — F988 Other specified behavioral and emotional disorders with onset usually occurring in childhood and adolescence: Secondary | ICD-10-CM

## 2016-08-16 MED ORDER — AMPHETAMINE-DEXTROAMPHETAMINE 30 MG PO TABS: 30.0000 mg | ORAL_TABLET | Freq: Three times a day (TID) | ORAL | 0 refills | Status: DC

## 2016-08-23 ENCOUNTER — Telehealth: Payer: Self-pay | Admitting: Family Medicine

## 2016-08-23 NOTE — Telephone Encounter (Signed)
Pt came in and dropped off some papers to be reviewed. Sending back.

## 2016-08-26 ENCOUNTER — Encounter: Payer: Self-pay | Admitting: Family Medicine

## 2016-11-26 ENCOUNTER — Encounter: Payer: Self-pay | Admitting: Family Medicine

## 2016-11-26 ENCOUNTER — Other Ambulatory Visit: Payer: Self-pay | Admitting: Family Medicine

## 2016-11-26 DIAGNOSIS — F988 Other specified behavioral and emotional disorders with onset usually occurring in childhood and adolescence: Secondary | ICD-10-CM

## 2016-11-26 DIAGNOSIS — G43001 Migraine without aura, not intractable, with status migrainosus: Secondary | ICD-10-CM

## 2016-11-28 MED ORDER — AMPHETAMINE-DEXTROAMPHETAMINE 30 MG PO TABS: 30.0000 mg | ORAL_TABLET | Freq: Three times a day (TID) | ORAL | 0 refills | Status: DC

## 2016-11-28 MED ORDER — PROMETHAZINE HCL 25 MG PO TABS: 25.0000 mg | ORAL_TABLET | Freq: Three times a day (TID) | ORAL | 0 refills | Status: DC | PRN

## 2016-11-29 ENCOUNTER — Telehealth: Payer: Self-pay

## 2016-11-29 NOTE — Telephone Encounter (Signed)
Mailed adderall RX called in phenergan per [employer]

## 2016-12-02 ENCOUNTER — Encounter: Payer: Self-pay | Admitting: Family Medicine

## 2016-12-13 ENCOUNTER — Other Ambulatory Visit: Payer: Self-pay | Admitting: Family Medicine

## 2016-12-13 NOTE — Telephone Encounter (Signed)
Is this okay to refill? 

## 2017-01-03 ENCOUNTER — Telehealth: Payer: Self-pay | Admitting: Family Medicine

## 2017-01-03 NOTE — Telephone Encounter (Signed)
P.A. DEX-AMPHETAMINE SALT COMBO

## 2017-02-04 ENCOUNTER — Other Ambulatory Visit: Payer: Self-pay | Admitting: Family Medicine

## 2017-02-04 DIAGNOSIS — F988 Other specified behavioral and emotional disorders with onset usually occurring in childhood and adolescence: Secondary | ICD-10-CM

## 2017-02-08 ENCOUNTER — Encounter: Payer: Self-pay | Admitting: Family Medicine

## 2017-02-08 ENCOUNTER — Telehealth: Payer: Self-pay | Admitting: Family Medicine

## 2017-02-08 DIAGNOSIS — F988 Other specified behavioral and emotional disorders with onset usually occurring in childhood and adolescence: Secondary | ICD-10-CM

## 2017-02-08 MED ORDER — AMPHETAMINE-DEXTROAMPHETAMINE 30 MG PO TABS: 30.0000 mg | ORAL_TABLET | Freq: Three times a day (TID) | ORAL | 0 refills | Status: DC

## 2017-02-08 NOTE — Telephone Encounter (Signed)
Let him know that I wrote the prescription

## 2017-02-08 NOTE — Telephone Encounter (Signed)
Left VM Rx has been written

## 2017-02-08 NOTE — Telephone Encounter (Signed)
Pt said to let you know he made an appt for Monday and it would be ok if you release his rx sooner.

## 2017-02-08 NOTE — Addendum Note (Signed)
Addended by: Ronnald NianLALONDE, Catheryne Deford C on: 02/08/2017 04:27 PM   Modules accepted: Orders

## 2017-02-14 ENCOUNTER — Ambulatory Visit (INDEPENDENT_AMBULATORY_CARE_PROVIDER_SITE_OTHER): Payer: BLUE CROSS/BLUE SHIELD | Admitting: Family Medicine

## 2017-02-14 ENCOUNTER — Encounter: Payer: Self-pay | Admitting: Family Medicine

## 2017-02-14 ENCOUNTER — Other Ambulatory Visit: Payer: Self-pay | Admitting: Family Medicine

## 2017-02-14 VITALS — BP 118/80 | HR 78 | Ht 68.5 in | Wt 161.0 lb

## 2017-02-14 DIAGNOSIS — Z8042 Family history of malignant neoplasm of prostate: Secondary | ICD-10-CM

## 2017-02-14 DIAGNOSIS — G43001 Migraine without aura, not intractable, with status migrainosus: Secondary | ICD-10-CM

## 2017-02-14 DIAGNOSIS — F341 Dysthymic disorder: Secondary | ICD-10-CM

## 2017-02-14 DIAGNOSIS — F909 Attention-deficit hyperactivity disorder, unspecified type: Secondary | ICD-10-CM

## 2017-02-14 DIAGNOSIS — Z87891 Personal history of nicotine dependence: Secondary | ICD-10-CM

## 2017-02-14 DIAGNOSIS — Z13220 Encounter for screening for lipoid disorders: Secondary | ICD-10-CM

## 2017-02-14 DIAGNOSIS — F988 Other specified behavioral and emotional disorders with onset usually occurring in childhood and adolescence: Secondary | ICD-10-CM | POA: Diagnosis not present

## 2017-02-14 LAB — COMPREHENSIVE METABOLIC PANEL
ALT: 14 U/L (ref 9–46)
AST: 18 U/L (ref 10–35)
Albumin: 4.6 g/dL (ref 3.6–5.1)
Alkaline Phosphatase: 70 U/L (ref 40–115)
BUN: 20 mg/dL (ref 7–25)
CHLORIDE: 103 mmol/L (ref 98–110)
CO2: 29 mmol/L (ref 20–31)
CREATININE: 1.08 mg/dL (ref 0.70–1.25)
Calcium: 9.3 mg/dL (ref 8.6–10.3)
GLUCOSE: 125 mg/dL — AB (ref 65–99)
POTASSIUM: 4.9 mmol/L (ref 3.5–5.3)
SODIUM: 141 mmol/L (ref 135–146)
Total Bilirubin: 0.5 mg/dL (ref 0.2–1.2)
Total Protein: 6.8 g/dL (ref 6.1–8.1)

## 2017-02-14 LAB — CBC WITH DIFFERENTIAL/PLATELET
BASOS ABS: 59 {cells}/uL (ref 0–200)
Basophils Relative: 1 %
EOS PCT: 2 %
Eosinophils Absolute: 118 cells/uL (ref 15–500)
HCT: 42.5 % (ref 38.5–50.0)
Hemoglobin: 14.6 g/dL (ref 13.2–17.1)
LYMPHS PCT: 27 %
Lymphs Abs: 1593 cells/uL (ref 850–3900)
MCH: 30.1 pg (ref 27.0–33.0)
MCHC: 34.4 g/dL (ref 32.0–36.0)
MCV: 87.6 fL (ref 80.0–100.0)
MONOS PCT: 8 %
MPV: 9.1 fL (ref 7.5–12.5)
Monocytes Absolute: 472 cells/uL (ref 200–950)
NEUTROS ABS: 3658 {cells}/uL (ref 1500–7800)
Neutrophils Relative %: 62 %
PLATELETS: 195 10*3/uL (ref 140–400)
RBC: 4.85 MIL/uL (ref 4.20–5.80)
RDW: 13.2 % (ref 11.0–15.0)
WBC: 5.9 10*3/uL (ref 4.0–10.5)

## 2017-02-14 LAB — LIPID PANEL
CHOL/HDL RATIO: 5.1 ratio — AB (ref <5.0)
Cholesterol: 210 mg/dL — ABNORMAL HIGH (ref <200)
HDL: 41 mg/dL (ref >40)
LDL CALC: 127 mg/dL — AB (ref <100)
Triglycerides: 211 mg/dL — ABNORMAL HIGH (ref <150)
VLDL: 42 mg/dL — AB (ref <30)

## 2017-02-14 MED ORDER — AMPHETAMINE-DEXTROAMPHETAMINE 30 MG PO TABS: 30.0000 mg | ORAL_TABLET | Freq: Three times a day (TID) | ORAL | 0 refills | Status: DC

## 2017-02-14 MED ORDER — PROPRANOLOL HCL 80 MG PO TABS: ORAL_TABLET | ORAL | 3 refills | Status: DC

## 2017-02-14 MED ORDER — PAROXETINE HCL 40 MG PO TABS: 40.0000 mg | ORAL_TABLET | Freq: Every morning | ORAL | 3 refills | Status: DC
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
## 2017-02-16 LAB — HEMOGLOBIN A1C
HEMOGLOBIN A1C: 5.4 % (ref <5.7)
MEAN PLASMA GLUCOSE: 108 mg/dL

## 2017-03-04 ENCOUNTER — Encounter: Payer: Self-pay | Admitting: Family Medicine

## 2017-05-06 ENCOUNTER — Encounter: Payer: Self-pay | Admitting: Family Medicine

## 2017-05-06 MED ORDER — ALUMINUM CHLORIDE 20 % EX SOLN: Freq: Every day | CUTANEOUS | 1 refills | Status: DC

## 2017-05-14 ENCOUNTER — Other Ambulatory Visit: Payer: Self-pay | Admitting: Family Medicine

## 2017-05-14 ENCOUNTER — Encounter: Payer: Self-pay | Admitting: Family Medicine

## 2017-05-14 DIAGNOSIS — F988 Other specified behavioral and emotional disorders with onset usually occurring in childhood and adolescence: Secondary | ICD-10-CM

## 2017-05-16 ENCOUNTER — Telehealth: Payer: Self-pay

## 2017-05-16 MED ORDER — AMPHETAMINE-DEXTROAMPHETAMINE 30 MG PO TABS: 30.0000 mg | ORAL_TABLET | Freq: Three times a day (TID) | ORAL | 0 refills | Status: DC

## 2017-05-16 NOTE — Telephone Encounter (Signed)
LM on VCM that Adderall scripts are ready for pick up. Trixie Rude/RLB

## 2017-05-17 ENCOUNTER — Encounter: Payer: Self-pay | Admitting: Family Medicine

## 2017-05-24 ENCOUNTER — Other Ambulatory Visit (INDEPENDENT_AMBULATORY_CARE_PROVIDER_SITE_OTHER): Payer: BLUE CROSS/BLUE SHIELD

## 2017-05-24 DIAGNOSIS — Z23 Encounter for immunization: Secondary | ICD-10-CM | POA: Diagnosis not present

## 2017-09-05 ENCOUNTER — Encounter: Payer: Self-pay | Admitting: Family Medicine

## 2017-09-05 DIAGNOSIS — F988 Other specified behavioral and emotional disorders with onset usually occurring in childhood and adolescence: Secondary | ICD-10-CM

## 2017-09-06 MED ORDER — AMPHETAMINE-DEXTROAMPHETAMINE 30 MG PO TABS: 30.0000 mg | ORAL_TABLET | Freq: Three times a day (TID) | ORAL | 0 refills | Status: DC

## 2017-09-09 ENCOUNTER — Other Ambulatory Visit: Payer: BLUE CROSS/BLUE SHIELD

## 2017-09-14 ENCOUNTER — Other Ambulatory Visit (INDEPENDENT_AMBULATORY_CARE_PROVIDER_SITE_OTHER): Payer: BLUE CROSS/BLUE SHIELD

## 2017-09-14 DIAGNOSIS — Z23 Encounter for immunization: Secondary | ICD-10-CM | POA: Diagnosis not present

## 2017-11-21 ENCOUNTER — Emergency Department (HOSPITAL_COMMUNITY): Payer: BLUE CROSS/BLUE SHIELD

## 2017-11-21 ENCOUNTER — Encounter (HOSPITAL_COMMUNITY): Payer: Self-pay

## 2017-11-21 ENCOUNTER — Other Ambulatory Visit: Payer: Self-pay

## 2017-11-21 ENCOUNTER — Emergency Department (HOSPITAL_COMMUNITY)
Admission: EM | Admit: 2017-11-21 | Discharge: 2017-11-22 | Disposition: A | Discharge status: Other Acute Inpatient Hospital | Payer: BLUE CROSS/BLUE SHIELD | Attending: Emergency Medicine | Admitting: Emergency Medicine

## 2017-11-21 DIAGNOSIS — F341 Dysthymic disorder: Secondary | ICD-10-CM | POA: Diagnosis present

## 2017-11-21 DIAGNOSIS — M542 Cervicalgia: Secondary | ICD-10-CM | POA: Diagnosis not present

## 2017-11-21 DIAGNOSIS — S0083XA Contusion of other part of head, initial encounter: Secondary | ICD-10-CM | POA: Diagnosis not present

## 2017-11-21 DIAGNOSIS — T424X2A Poisoning by benzodiazepines, intentional self-harm, initial encounter: Secondary | ICD-10-CM | POA: Diagnosis present

## 2017-11-21 DIAGNOSIS — X838XXA Intentional self-harm by other specified means, initial encounter: Secondary | ICD-10-CM | POA: Insufficient documentation

## 2017-11-21 DIAGNOSIS — Y999 Unspecified external cause status: Secondary | ICD-10-CM | POA: Diagnosis not present

## 2017-11-21 DIAGNOSIS — S0990XA Unspecified injury of head, initial encounter: Secondary | ICD-10-CM | POA: Diagnosis present

## 2017-11-21 DIAGNOSIS — Z87891 Personal history of nicotine dependence: Secondary | ICD-10-CM | POA: Diagnosis not present

## 2017-11-21 DIAGNOSIS — Y929 Unspecified place or not applicable: Secondary | ICD-10-CM | POA: Diagnosis not present

## 2017-11-21 DIAGNOSIS — R45851 Suicidal ideations: Secondary | ICD-10-CM | POA: Diagnosis not present

## 2017-11-21 DIAGNOSIS — F332 Major depressive disorder, recurrent severe without psychotic features: Secondary | ICD-10-CM | POA: Diagnosis present

## 2017-11-21 DIAGNOSIS — Z79899 Other long term (current) drug therapy: Secondary | ICD-10-CM | POA: Diagnosis not present

## 2017-11-21 DIAGNOSIS — R55 Syncope and collapse: Secondary | ICD-10-CM | POA: Diagnosis not present

## 2017-11-21 DIAGNOSIS — Z046 Encounter for general psychiatric examination, requested by authority: Secondary | ICD-10-CM | POA: Insufficient documentation

## 2017-11-21 DIAGNOSIS — G43909 Migraine, unspecified, not intractable, without status migrainosus: Secondary | ICD-10-CM | POA: Diagnosis present

## 2017-11-21 DIAGNOSIS — F988 Other specified behavioral and emotional disorders with onset usually occurring in childhood and adolescence: Secondary | ICD-10-CM | POA: Diagnosis present

## 2017-11-21 DIAGNOSIS — Y939 Activity, unspecified: Secondary | ICD-10-CM | POA: Diagnosis not present

## 2017-11-21 LAB — COMPREHENSIVE METABOLIC PANEL
ALBUMIN: 4.4 g/dL (ref 3.5–5.0)
ALT: 28 U/L (ref 17–63)
ANION GAP: 8 (ref 5–15)
AST: 48 U/L — AB (ref 15–41)
Alkaline Phosphatase: 68 U/L (ref 38–126)
BILIRUBIN TOTAL: 1 mg/dL (ref 0.3–1.2)
BUN: 13 mg/dL (ref 6–20)
CALCIUM: 8.8 mg/dL — AB (ref 8.9–10.3)
CO2: 25 mmol/L (ref 22–32)
Chloride: 108 mmol/L (ref 101–111)
Creatinine, Ser: 1.41 mg/dL — ABNORMAL HIGH (ref 0.61–1.24)
GFR calc Af Amer: 59 mL/min — ABNORMAL LOW (ref >60)
GFR calc non Af Amer: 51 mL/min — ABNORMAL LOW (ref >60)
GLUCOSE: 124 mg/dL — AB (ref 65–99)
Potassium: 4 mmol/L (ref 3.5–5.1)
Sodium: 141 mmol/L (ref 135–145)
TOTAL PROTEIN: 6.8 g/dL (ref 6.5–8.1)

## 2017-11-21 LAB — CBC
HEMATOCRIT: 43.7 % (ref 39.0–52.0)
Hemoglobin: 15.2 g/dL (ref 13.0–17.0)
MCH: 30.4 pg (ref 26.0–34.0)
MCHC: 34.8 g/dL (ref 30.0–36.0)
MCV: 87.4 fL (ref 78.0–100.0)
Platelets: 187 10*3/uL (ref 150–400)
RBC: 5 MIL/uL (ref 4.22–5.81)
RDW: 12.3 % (ref 11.5–15.5)
WBC: 7.5 10*3/uL (ref 4.0–10.5)

## 2017-11-21 LAB — URINALYSIS, ROUTINE W REFLEX MICROSCOPIC
Bilirubin Urine: NEGATIVE
Glucose, UA: NEGATIVE mg/dL
Hgb urine dipstick: NEGATIVE
Ketones, ur: 20 mg/dL — AB
Leukocytes, UA: NEGATIVE
Nitrite: NEGATIVE
Protein, ur: NEGATIVE mg/dL
Specific Gravity, Urine: 1.009 (ref 1.005–1.030)
pH: 5 (ref 5.0–8.0)

## 2017-11-21 LAB — RAPID URINE DRUG SCREEN, HOSP PERFORMED
Amphetamines: POSITIVE — AB
BARBITURATES: NOT DETECTED
Benzodiazepines: POSITIVE — AB
Cocaine: NOT DETECTED
Opiates: NOT DETECTED
Tetrahydrocannabinol: NOT DETECTED

## 2017-11-21 LAB — CBG MONITORING, ED: GLUCOSE-CAPILLARY: 92 mg/dL (ref 65–99)

## 2017-11-21 LAB — SALICYLATE LEVEL: Salicylate Lvl: <7 mg/dL (ref 2.8–30.0)

## 2017-11-21 LAB — ETHANOL: Alcohol, Ethyl (B): <10 mg/dL (ref <10)

## 2017-11-21 LAB — ACETAMINOPHEN LEVEL

## 2017-11-21 MED ORDER — SODIUM CHLORIDE 0.9 % IV BOLUS (SEPSIS)
1000.0000 mL | Freq: Once | INTRAVENOUS | Status: AC
  Administered 2017-11-21: 1000 mL via INTRAVENOUS

## 2017-11-21 MED ORDER — ACETAMINOPHEN 500 MG PO TABS
1000.0000 mg | ORAL_TABLET | Freq: Four times a day (QID) | ORAL | Status: DC | PRN
  Administered 2017-11-21 – 2017-11-22 (×2): 1000 mg via ORAL
  Filled 2017-11-21 (×2): qty 2

## 2017-11-21 NOTE — ED Notes (Signed)
Patient transported to X-ray 

## 2017-11-21 NOTE — ED Notes (Signed)
Poison Control notified

## 2017-11-21 NOTE — ED Notes (Signed)
Pt undressed of street clothes, dressed in paper scrubs, wanded by security, staffing office was called and they state that there is no sitters available right now but will be at 7p

## 2017-11-21 NOTE — ED Notes (Addendum)
Sitter informed nurse that patient has stated twice that he wants to die. However, when asked by nurse patient is denying. Sitter at bedside
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
## 2017-11-21 NOTE — ED Notes (Signed)
Two white colored rings left on patients hand. Pt belongings wanded by security. Pt belongings placed in [place].

## 2017-11-21 NOTE — ED Notes (Signed)
Per sitter, pt stated "I'm not peeing until I get pain medication."

## 2017-11-21 NOTE — ED Notes (Signed)
Patient packet sent to security. 3 rings
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
## 2017-11-21 NOTE — ED Notes (Signed)
Staffing notified of need for sitter. Staffing reports sitter available at 1900.

## 2017-11-21 NOTE — ED Notes (Signed)
TTS in process 

## 2017-11-21 NOTE — ED Notes (Signed)
Sitter present at bedside.
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
## 2017-11-21 NOTE — ED Notes (Signed)
CBG-92 per Sheria Langameron, NT

## 2017-11-21 NOTE — ED Notes (Signed)
Per poison control, no further recommendations and no longer monitoring patient.

## 2017-11-21 NOTE — ED Notes (Signed)
Patient transported to CT 

## 2017-11-22 ENCOUNTER — Encounter (HOSPITAL_COMMUNITY): Payer: Self-pay | Admitting: Psychiatry

## 2017-11-22 ENCOUNTER — Inpatient Hospital Stay
Admission: AD | Admit: 2017-11-22 | Discharge: 2017-11-24 | DRG: 881 | Disposition: A | Discharge status: Home / Self Care | Payer: BLUE CROSS/BLUE SHIELD | Source: Intra-hospital | Attending: Psychiatry | Admitting: Psychiatry

## 2017-11-22 ENCOUNTER — Other Ambulatory Visit: Payer: Self-pay

## 2017-11-22 DIAGNOSIS — Z915 Personal history of self-harm: Secondary | ICD-10-CM

## 2017-11-22 DIAGNOSIS — Z79899 Other long term (current) drug therapy: Secondary | ICD-10-CM | POA: Diagnosis not present

## 2017-11-22 DIAGNOSIS — Z87891 Personal history of nicotine dependence: Secondary | ICD-10-CM | POA: Diagnosis not present

## 2017-11-22 DIAGNOSIS — G4700 Insomnia, unspecified: Secondary | ICD-10-CM | POA: Diagnosis present

## 2017-11-22 DIAGNOSIS — G43909 Migraine, unspecified, not intractable, without status migrainosus: Secondary | ICD-10-CM | POA: Diagnosis present

## 2017-11-22 DIAGNOSIS — F329 Major depressive disorder, single episode, unspecified: Secondary | ICD-10-CM | POA: Diagnosis present

## 2017-11-22 DIAGNOSIS — F341 Dysthymic disorder: Secondary | ICD-10-CM | POA: Diagnosis present

## 2017-11-22 DIAGNOSIS — S0083XA Contusion of other part of head, initial encounter: Secondary | ICD-10-CM | POA: Diagnosis not present

## 2017-11-22 DIAGNOSIS — F909 Attention-deficit hyperactivity disorder, unspecified type: Secondary | ICD-10-CM | POA: Diagnosis present

## 2017-11-22 DIAGNOSIS — F419 Anxiety disorder, unspecified: Secondary | ICD-10-CM | POA: Diagnosis present

## 2017-11-22 DIAGNOSIS — Z885 Allergy status to narcotic agent status: Secondary | ICD-10-CM

## 2017-11-22 DIAGNOSIS — F332 Major depressive disorder, recurrent severe without psychotic features: Secondary | ICD-10-CM | POA: Diagnosis present

## 2017-11-22 DIAGNOSIS — K219 Gastro-esophageal reflux disease without esophagitis: Secondary | ICD-10-CM | POA: Diagnosis present

## 2017-11-22 DIAGNOSIS — T424X2A Poisoning by benzodiazepines, intentional self-harm, initial encounter: Secondary | ICD-10-CM | POA: Diagnosis present

## 2017-11-22 MED ORDER — TRAZODONE HCL 100 MG PO TABS: 100.0000 mg | ORAL_TABLET | Freq: Every day | ORAL | Status: DC

## 2017-11-22 MED ORDER — HYDROXYZINE HCL 50 MG PO TABS: 50.0000 mg | ORAL_TABLET | Freq: Three times a day (TID) | ORAL | Status: DC | PRN

## 2017-11-22 MED ORDER — ONDANSETRON 4 MG PO TBDP
4.0000 mg | ORAL_TABLET | Freq: Once | ORAL | Status: AC
  Administered 2017-11-22: 4 mg via ORAL
  Filled 2017-11-22: qty 1

## 2017-11-22 MED ORDER — ALUM & MAG HYDROXIDE-SIMETH 200-200-20 MG/5ML PO SUSP: 30.0000 mL | ORAL | Status: DC | PRN

## 2017-11-22 MED ORDER — PSYLLIUM 95 % PO PACK: 1.0000 | PACK | Freq: Every day | ORAL | Status: DC

## 2017-11-22 MED ORDER — PAROXETINE HCL 20 MG PO TABS: 40.0000 mg | ORAL_TABLET | Freq: Every day | ORAL | Status: DC

## 2017-11-22 MED ORDER — MAGNESIUM HYDROXIDE 400 MG/5ML PO SUSP: 30.0000 mL | Freq: Every day | ORAL | Status: DC | PRN

## 2017-11-22 MED ORDER — SUMATRIPTAN SUCCINATE 100 MG PO TABS: 100.0000 mg | ORAL_TABLET | ORAL | Status: DC | PRN

## 2017-11-22 MED ORDER — ACETAMINOPHEN 325 MG PO TABS
650.0000 mg | ORAL_TABLET | Freq: Four times a day (QID) | ORAL | Status: DC | PRN
Start: 2017-11-22 — End: 2017-11-22

## 2017-11-22 MED ORDER — PROPRANOLOL HCL 40 MG PO TABS
80.0000 mg | ORAL_TABLET | Freq: Two times a day (BID) | ORAL | Status: DC
  Administered 2017-11-22: 80 mg via ORAL
  Filled 2017-11-22: qty 2

## 2017-11-22 MED ORDER — PROPRANOLOL HCL 40 MG PO TABS: 80.0000 mg | ORAL_TABLET | Freq: Two times a day (BID) | ORAL | Status: DC

## 2017-11-22 MED ORDER — PAROXETINE HCL 20 MG PO TABS
40.0000 mg | ORAL_TABLET | Freq: Every morning | ORAL | Status: DC
  Administered 2017-11-22: 40 mg via ORAL
  Filled 2017-11-22: qty 2

## 2017-11-22 NOTE — ED Notes (Signed)
Pt's belongings bag and valuables envelope given to [place].

## 2017-11-22 NOTE — ED Notes (Signed)
Woke pt d/t dinner tray delivered to pt. Pt asking for "sleeping pill".

## 2017-11-22 NOTE — ED Provider Notes (Signed)
Patient is alert and nontoxic.  He complains of anxiety but otherwise has no acute complaints.  Heart is regular.  Lungs are clear.  No respiratory distress.  No peripheral edema.  All movements coordinated purposeful symmetric.  [facility] with excepting Dr.Pucilowska. EMTALA completed.   Arby BarrettePfeiffer, Oswin Johal, MD 11/22/17 2057

## 2017-11-22 NOTE — ED Notes (Signed)
EMTALA reviewed by this Charge RN 

## 2017-11-22 NOTE — ED Provider Notes (Signed)
12:12 AM  D/w Berna SpareMarcus with TTS.  He has recommended inpatient psychiatric treatment.  They will look for placement.   Tabbetha Kutscher, Layla MawKristen N, DO 11/22/17 [phone]

## 2017-11-22 NOTE — ED Notes (Signed)
Snack given as requested.  
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
## 2017-11-22 NOTE — ED Notes (Signed)
Patient complains of nausea. EDP (Ward) informed.

## 2017-11-22 NOTE — ED Notes (Signed)
Pt requesting medication to help him sleep, attempts made to explain to patient it is nearly morning and he had no sedatives in his PRN list at this time. Pt requesting something that starts with a "D" that could help his sleep.  Pt appears restless, skin dry, pt denies pain, endorses nausea but continues to drink water at bedside.  Will continue to monitor

## 2017-11-22 NOTE — ED Notes (Signed)
Patient extremely unsteady on way to the bathroom. Fall precautions initiated. Patient advised of precautions and informed to notify staff when he was in need of assistance.

## 2017-11-22 NOTE — ED Notes (Signed)
Pt provided additional water by sitter

## 2017-11-22 NOTE — BH Assessment (Signed)
Patient has been accepted to [facility].  Accepting physician is Dr. Jennet MaduroPucilowska.  Attending Physician will be Dr. Johnella MoloneyMcNew.  Patient has [facility] to room 302, [facility] [place] SquarePhyllis.  Call report to [phone].  Representative/Transfer Coordinator is Warden/rangerCalvin Patient pre-admitted by [facility] Access Ivin Booty(Joshua)  [facility] Staff [place](Jalon, TTS & Lillia AbedLindsay, [facility] made aware of acceptance.

## 2017-11-22 NOTE — ED Notes (Addendum)
Pt has been asking intermittently repeatedly for "sleeping pills". Advised pt no sleeping meds are given during the day and that he has been sleeping most of the day. Pt asked for Tylenol - when given, pt stated "Slip me some Demerol in w/this". Advised pt unable to honor this request.

## 2017-11-22 NOTE — ED Notes (Addendum)
Regular diet lunch tray ordered @ 1009, w/ 'no sharps' requested.  

## 2017-11-22 NOTE — Tx Team (Signed)
Initial Treatment Plan 11/22/2017 11:50 PM Dakota RompGlenn Driskill JXB:[id number]    PATIENT STRESSORS: Financial difficulties Medication change or noncompliance Substance abuse   PATIENT STRENGTHS: Average or above average intelligence General fund of knowledge Religious Affiliation   PATIENT IDENTIFIED PROBLEMS: Alcohol use    Weak and unstable gait    Medication mis-use, depression     Incompliant with medical regimen          DISCHARGE CRITERIA:  Ability to meet basic life and health needs Medical problems require only outpatient monitoring Reduction of life-threatening or endangering symptoms to within safe limits  PRELIMINARY DISCHARGE PLAN: Attend PHP/IOP Attend 12-step recovery group Return to previous living arrangement  PATIENT/FAMILY INVOLVEMENT: This treatment plan has been presented to and reviewed with the patient, Dakota Gray,.  The patient have been given the opportunity to ask questions and make suggestions.  Lelan PonsAlexander  Phoua Hoadley, RN 11/22/2017, 11:50 PM

## 2017-11-22 NOTE — ED Notes (Signed)
Pt using urinal d/t unsteady gait.

## 2017-11-22 NOTE — ED Notes (Signed)
Encouraged pt to eat dinner.

## 2017-11-22 NOTE — ED Notes (Signed)
Bed adjusted by sitter

## 2017-11-23 DIAGNOSIS — F331 Major depressive disorder, recurrent, moderate: Secondary | ICD-10-CM

## 2017-11-23 MED ORDER — PAROXETINE HCL 20 MG PO TABS
40.0000 mg | ORAL_TABLET | Freq: Every day | ORAL | Status: DC
  Administered 2017-11-23: 40 mg via ORAL
  Filled 2017-11-23 (×2): qty 2

## 2017-11-23 MED ORDER — PROPRANOLOL HCL ER 80 MG PO CP24
80.0000 mg | ORAL_CAPSULE | Freq: Every day | ORAL | Status: DC
  Administered 2017-11-23: 80 mg via ORAL
  Filled 2017-11-23 (×2): qty 1

## 2017-11-23 MED ORDER — MAGNESIUM HYDROXIDE 400 MG/5ML PO SUSP: 30.0000 mL | Freq: Every day | ORAL | Status: DC | PRN

## 2017-11-23 MED ORDER — ALUM & MAG HYDROXIDE-SIMETH 200-200-20 MG/5ML PO SUSP
30.0000 mL | ORAL | Status: DC | PRN
  Administered 2017-11-23: 30 mL via ORAL
  Filled 2017-11-23: qty 30

## 2017-11-23 MED ORDER — HYDROXYZINE HCL 50 MG PO TABS
50.0000 mg | ORAL_TABLET | Freq: Three times a day (TID) | ORAL | Status: DC | PRN
  Administered 2017-11-24: 50 mg via ORAL
  Filled 2017-11-23: qty 1

## 2017-11-23 MED ORDER — ENSURE ENLIVE PO LIQD
237.0000 mL | Freq: Two times a day (BID) | ORAL | Status: DC
  Administered 2017-11-23 – 2017-11-24 (×3): 237 mL via ORAL

## 2017-11-23 MED ORDER — ACETAMINOPHEN 325 MG PO TABS
650.0000 mg | ORAL_TABLET | Freq: Four times a day (QID) | ORAL | Status: DC | PRN
Start: 2017-11-23 — End: 2017-11-24

## 2017-11-23 MED ORDER — GABAPENTIN 600 MG PO TABS
600.0000 mg | ORAL_TABLET | Freq: Three times a day (TID) | ORAL | Status: DC
  Administered 2017-11-23 – 2017-11-24 (×4): 600 mg via ORAL
  Filled 2017-11-23 (×4): qty 1

## 2017-11-23 MED ORDER — TRAZODONE HCL 50 MG PO TABS
150.0000 mg | ORAL_TABLET | Freq: Every day | ORAL | Status: DC
  Administered 2017-11-23: 150 mg via ORAL
  Filled 2017-11-23: qty 1

## 2017-11-23 NOTE — Plan of Care (Signed)
Patient understands his condition and verbalizes understanding of what needs to be done in order for him to discharge tomorrow. Patient has had minimal to no complications during this visit. Patient has remained free from injury during this visit and is safe on the unit at this time.

## 2017-11-23 NOTE — [facility] Suicide Risk Assessment (Signed)
[facility]   Nursing information obtained from:    Demographic factors:    Current Mental Status:   depressed Loss Factors:   family loss Historical Factors:    Risk Reduction Factors:   future oreinted  Total Time spent with patient: 1 hour Principal Problem: MDD (major depressive disorder) Diagnosis:   Patient Active Problem List   Diagnosis Date Noted  . MDD (major depressive disorder) [F32.9] 11/23/2017  . Major depressive disorder, recurrent severe without psychotic features (HCC) [F33.2] 11/22/2017  . Intentional benzodiazepine overdose (HCC) [T42.4X2A] 11/22/2017  . Former smoker [Z61.096][Z87.891] 02/14/2017  . Internal hemorrhoids with bleeding, prolapse and fecal soiling [K64.8] 05/31/2014  . ADD (attention deficit disorder) [F98.8] 05/09/2012  . Dysthymia [F34.1] 05/09/2012  . Migraine headache [G43.909] 05/09/2012  . Dermatitis [L30.9] 05/09/2012   Subjective Data: See H&P  Continued Clinical Symptoms:  Alcohol Use Disorder Identification Test Final Score (AUDIT): 8 The "Alcohol Use Disorders Identification Test", Guidelines for Use in Primary Care, Second Edition.  World Science writerHealth Organization [facility](WHO). Score between 0-7:  no or low risk or alcohol related problems. Score between 8-15:  moderate risk of alcohol related problems. Score between 16-19:  high risk of alcohol related problems. Score 20 or above:  warrants further diagnostic evaluation for alcohol dependence and treatment.   CLINICAL FACTORS:   Severe Anxiety and/or Agitation   Musculoskeletal: Strength & Muscle Tone: within normal limits Gait & Station: normal Patient leans: N/A  Psychiatric Specialty Exam: Physical Exam  ROS  Blood pressure (!) 160/105, pulse (!) 108, temperature 98.5 F (36.9 C), temperature source Oral, resp. rate 16, height 5\' 9"  (1.753 m), weight 72.6 kg (160 lb), SpO2 96 %.Body mass index is 23.63 kg/m.                                                           COGNITIVE FEATURES THAT CONTRIBUTE TO RISK:  None    SUICIDE RISK:   Mild:  Suicidal ideation of limited frequency, intensity, duration, and specificity.  There are no identifiable plans, no associated intent, mild dysphoria and related symptoms, good self-control (both objective and subjective assessment), few other risk factors, and identifiable protective factors, including available and accessible social support.  PLAN OF CARE: See H&P  I certify that inpatient services furnished can reasonably be expected to improve the patient's condition.   Haskell RilingHolly R Torben Soloway, MD 11/23/2017, 3:38 PM

## 2017-11-23 NOTE — Progress Notes (Signed)
D- Patient alert and oriented. Patient presents in a pleasant mood. Patient denies SI, HI, AVH, and pain at this time. Patient states that his anxiety level is at "2/10 because of tomorrow" patient is up for discharge 11/24/2017. Patient denies having any signs/symptoms of depression at this time.  A- Scheduled medications administered to patient, per MD orders. Support and encouragement provided.  Routine safety checks conducted every 15 minutes.  Patient informed to notify staff with problems or concerns.  R- No adverse drug reactions noted. Patient contracts for safety at this time. Patient compliant with medications and treatment plan. Patient receptive, calm, and cooperative. Patient interacts well with others on the unit.  Patient remains safe at this time.
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
## 2017-11-23 NOTE — [facility] Group Notes (Signed)
[facility] Group Notes:  (Nursing/MHT/Case Management/Adjunct)  Date:  11/23/2017  Time:  9:30 PM  Type of Therapy:  Group Therapy  Participation Level:  Did Not Attend  Participation Quality: Mayra NeerJackie L Anan Dapolito 11/23/2017, 9:30 PM

## 2017-11-23 NOTE — [facility] Suicide Risk Assessment (Signed)
[facility] INPATIENT:  Family/Significant Other Suicide Prevention Education  Suicide Prevention Education:  Patient Refusal for Family/Significant Other Suicide Prevention Education: The patient Dakota Gray has refused to provide written consent for family/significant other to be provided Family/Significant Other Suicide Prevention Education during admission and/or prior to discharge.  Physician notified.  Johny ShearsCassandra  Yoshi Mancillas 11/23/2017, 1:55 PM
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
## 2017-11-23 NOTE — Progress Notes (Signed)
Recreation Therapy Notes   Date: 11.28.18  Time: 1:00pm  Location: Craft Room  Behavioral response: N/A  Intervention Topic: Stress  Discussion/Intervention: Patient did not attend group. Clinical Observations/Feedback:  Patient did not attend group.  Maudie Shingledecker LRT/CTRS         Dakota Gray 11/23/2017 2:35 PM

## 2017-11-23 NOTE — [facility] Group Notes (Signed)
11/23/2017 9:30am  Type of Therapy/Topic:  Group Therapy:  Emotion Regulation  Participation Level:  Active   Description of Group:   The purpose of this group is to assist patients in learning to regulate negative emotions and experience positive emotions. Patients will be guided to discuss ways in which they have been vulnerable to their negative emotions. These vulnerabilities will be juxtaposed with experiences of positive emotions or situations, and patients will be challenged to use positive emotions to combat negative ones. Special emphasis will be placed on coping with negative emotions in conflict situations, and patients will process healthy conflict resolution skills.  Therapeutic Goals: 1. Patient will identify two positive emotions or experiences to reflect on in order to balance out negative emotions 2. Patient will label two or more emotions that they find the most difficult to experience 3. Patient will demonstrate positive conflict resolution skills through discussion and/or role plays  Summary of Patient Progress:  Stayed majority of the time, until doctor called for him, actively engaged. Dakota Gray states that he is "feeling worried because he had to leave his cat to come into the hospital". He was able to identify negative emotions that he experiences such as love and how different situations has caused him to be vulnerable with his emotions. Mood was good.   Therapeutic Modalities:   Cognitive Behavioral Therapy Feelings Identification Dialectical Behavioral Therapy   Johny ShearsCassandra  Dakota Offutt, LCSW 11/23/2017 11:21 AM
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
## 2017-11-23 NOTE — Progress Notes (Signed)
Admitted patient in the unit with few belongings voluntary admitted, appear cam and cooperative to care staff, patient was processed and searched for any contraband, none is found, food and snack was offered and declined by patient, patient states 'I'm tired and wants to sleep, room was ready and patient to bed, denies any suicide ideation, skin check is complete by two nurses noted
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
## 2017-11-24 MED ORDER — GABAPENTIN 600 MG PO TABS: 600.0000 mg | ORAL_TABLET | Freq: Three times a day (TID) | ORAL | 0 refills | Status: DC

## 2017-11-24 NOTE — [facility] Group Notes (Signed)
11/24/2017  Time: 0930  Type of Therapy/Topic:  Group Therapy:  Balance in Life  Participation Level:  Active  Description of Group:   This group will address the concept of balance and how it feels and looks when one is unbalanced. Patients will be encouraged to process areas in their lives that are out of balance and identify reasons for remaining unbalanced. Facilitators will guide patients in utilizing problem-solving interventions to address and correct the stressor making their life unbalanced. Understanding and applying boundaries will be explored and addressed for obtaining and maintaining a balanced life. Patients will be encouraged to explore ways to assertively make their unbalanced needs known to significant others in their lives, using other group members and facilitator for support and feedback.  Therapeutic Goals: 1. Patient will identify two or more emotions or situations they have that consume much of in their lives. 2. Patient will identify signs/triggers that life has become out of balance:  3. Patient will identify two ways to set boundaries in order to achieve balance in their lives:  4. Patient will demonstrate ability to communicate their needs through discussion and/or role plays  Summary of Patient Progress: Pt continues to work towards his tx goals. Sherrine MaplesGlenn reported that the area of his life he feels he devotes too much attention to is "financial. I lost a lot of money in 2008, and now I'm living on a very fixed income so I spend a lot of time thinking about it." Sherrine MaplesGlenn reported that he would like to devote more attention to the, "work and personal relationships areas of his life. I've always liked to help others, and I think I would like to volunteer at a nursing home or something like that." Pt was able to appropriately participate in group discussions, and was able to offer validation and support to other group members as well.   Therapeutic Modalities:   Cognitive  Behavioral Therapy Solution-Focused Therapy Assertiveness Training   Heidi DachKelsey Anuhea Gassner, MSW, LCSW 11/24/2017 10:17 AM

## 2017-11-24 NOTE — Progress Notes (Signed)
Patient ID: Dakota Gray, male   DOB: [birth year], 66 y.o.   MRN: [id number] Odd and bizarre behavior, isolative to room, irritable and hesitant to get up for bedtime medications "I am trying to sleep why are you disturbing me ..." Patient came back, at least 3 times, seeking benzodiazepines; complained that the Trazodone 150 mg is not working, received beverages per request and later received hydroxyzine 50 mg for anxiety; denied SI/HI/AVH.

## 2017-11-24 NOTE — Progress Notes (Signed)
Patient ID: Dakota Gray, male   DOB: [birth year], 66 y.o.   MRN: [id number] CSW engaged in conversations with pt regarding how he was going to be transported back to his [place] in [place], [place].  CSW asked pt if he would identify any family members or friends that he would contact that may be willing to come get him for the hospital.  Pt informed CSW that he did have some friends that he would be able to contact if he had his phone available in which he would be able to retrieve the numbers from.  Pt informed CSW that his phone was at his home.  Pt asked CSW to "google" a friend of his named Tama GanderSharon Swift informed for him to get her contact information.  Pt identifed Ms. Swift and gave CSW her contact information.  CSW attempted to contact Ms. Swift on pt's behalf at [phone].  CSW only received Ms. Swift's VM and left a message just asking her to contact CSW.  CSW discussed the option of pt taking the [place] bus to [place], [place].  Pt shared that he was a little nervous about taking the bus as he has never been on a bus and he had many questions such as where is the bus going, how far would be bus be from his home, how would get get to his home from the bus stop.  CSW presented pt with a the PART bus route and explained to him that he would receive a PART bus pass as well as a pass for [facility] that would allow him to take a bus to the closest bus stop to his home.  CSW informed pt that he would be given the route information for the [place] transit as well as he could ask the bus driver for assistance.  CSW noted pt's increasing anxiety as he discussed his concerns with having to travel via a bus.  Pt informed CSW that he may be able to use [employer] as he had an account with them.  CSW assisted clt with assessing the [employer] website.  Pt attempted to input his account information, but was unable to assess his account.  CSW inputted pt's information (with his permission) and was unable to assess his account.   CSW was unable to identify any contact information for [employer].  Pt informed CSW that he would be willing to pay for a cab to get him back to [place].  CSW assisted pt with contacting [employer] to inquire if they accepted credit cards for payment.  Pt informed CSW that his credit card was in his wallet which is currently secured.  CSW informed pt that his nurse could get his credit card for him.  It was discovered by CSW and his nurse Neysa Bonito(Christy) that pt did not have a wallet with him.    CSW discussed the option of pt taking the bus with pt, but informed him that the next bus would not be leaving until 5:39 PM.  Pt began very irritated with this information citing that he has been waiting to be discharged since this morning.  CSW discussed case with CSW ChiropodistAssistant Director, Grandville SilosZachary Brooks, who would have to approve a taxi.  CSW was given approval to use a taxi if needed.  Due to the pt's increasing anxiety and irritability as well as the fact that it would be several hours before pt would be able to get on the PART bus.  All other viable options had been exhausted.  CSW informed pt and his nurse that a taxi voucher would be utilized to transport him back to [place], [place].  The voucher was given to the nurse who contacted [employer] and arranged for transportation.

## 2017-11-24 NOTE — Progress Notes (Signed)
  [facility] Discharge Plan :  Will you be returning to the same living situation after discharge:  Yes,  Pt will be returning to his apartment in [place], [place]. At discharge, do you have transportation home?: Yes,  Pt will be taking a taxi from the hospital directly to his apartment. Do you have the ability to pay for your medications: Yes,  Pt stated that he has the financial means to pay for his medications.  Release of information consent forms completed and in the chart;  Patient's signature needed at discharge.  Patient to Follow up at: Follow-up Information    Group, [facility]. Go on 11/30/2017.   Specialty:  Behavioral Health Why:  Wednesday November 30, 2017 at 10:20am with Dr. Marlyne BeardsJennings. Contact information: [address] [place] [address] [phone]           Next level of care provider has access to [facility] Link:no  Safety Planning and Suicide Prevention discussed: Yes,  Pt denies having any guns or weapons in his home.  Have you used any form of tobacco in the last 30 days? (Cigarettes, Smokeless Tobacco, Cigars, and/or Pipes): No  Has patient been referred to the Quitline?: N/A patient is not a smoker  Patient has been referred for addiction treatment: N/A  Alease FrameSonya S Jarrett Chicoine, LCSW 11/24/2017, 2:38 PM
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
## 2017-11-24 NOTE — Progress Notes (Signed)
Recreation Therapy Notes  INPATIENT RECREATION TR PLAN  Patient Details Name: Graycen Sadlon MRN: [id number] DOB: 12-23-1951 Today's Date: 11/24/2017  Rec Therapy Plan Is patient appropriate for Therapeutic Recreation?: Yes Treatment times per week: at least 3 Estimated Length of Stay: 5-7 DAYS TR Treatment/Interventions: Group participation (Comment)  Discharge Criteria Pt will be discharged from therapy if:: Discharged Treatment plan/goals/alternatives discussed and agreed upon by:: Patient/family  Discharge Summary Short term goals set:  Patient will attend and participate in Recreation Therapy Group Sessions Short term goals met: Not met Progress toward goals comments: Groups attended(NONE) Which groups?: (None) Reason goals not met: Patient did not attend any groups Therapeutic equipment acquired: N/A Reason patient discharged from therapy: Discharge from hospital Pt/family agrees with progress & goals achieved: Yes Date patient discharged from therapy: 11/24/17   Jessicca Stitzer 11/24/2017, 2:20 PM

## 2017-11-24 NOTE — Progress Notes (Signed)
Recreation Therapy Notes   Date: 11.29.18  Time: 1:00pm  Location: Craft Room  Behavioral response: N/A  Intervention Topic: Anger  Discussion/Intervention: Patient did not attend group. Clinical Observations/Feedback:  Patient did not attend group. Kamrie Fanton LRT/CTRS           Tarin Navarez 11/24/2017 2:03 PM
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
## 2017-11-24 NOTE — Plan of Care (Signed)
Patient up in bed. States he is wanting to go home. He denies si, hi, avh. States his script was from the 90s. He stated he felt overwhelmed. He is med compliant and has been going to groups.

## 2017-11-24 NOTE — Plan of Care (Addendum)
Poor insight, threatened to sign himself out because he was unable to have Valium, nursing staffs will continue to provide clinical support

## 2017-11-24 NOTE — BHH Suicide Risk Assessment (Signed)
[facility]   Principal Problem: MDD (major depressive disorder) Discharge Diagnoses:  Patient Active Problem List   Diagnosis Date Noted  . MDD (major depressive disorder) [F32.9] 11/23/2017  . Major depressive disorder, recurrent severe without psychotic features (HCC) [F33.2] 11/22/2017  . Intentional benzodiazepine overdose (HCC) [T42.4X2A] 11/22/2017  . Former smoker [U98.119][Z87.891] 02/14/2017  . Internal hemorrhoids with bleeding, prolapse and fecal soiling [K64.8] 05/31/2014  . ADD (attention deficit disorder) [F98.8] 05/09/2012  . Dysthymia [F34.1] 05/09/2012  . Migraine headache [G43.909] 05/09/2012  . Dermatitis [L30.9] 05/09/2012    Total Time spent with patient: 20 minutes  Musculoskeletal: Strength & Muscle Tone: within normal limits Gait & Station: normal Patient leans: N/A  Psychiatric Specialty Exam: ROS  Blood pressure (!) 131/92, pulse 88, temperature 98.5 F (36.9 C), resp. rate 18, height 5\' 9"  (1.753 m), weight 72.6 kg (160 lb), SpO2 97 %.Body mass index is 23.63 kg/m.     Mental Status Per Nursing Assessment::   On Admission:     Demographic Factors:  Male, Caucasian and Living alone  Loss Factors: Loss of family   Historical Factors: Impulsivity  Risk Reduction Factors:   Positive therapeutic relationship and Positive coping skills or problem solving skills  Continued Clinical Symptoms:  Panic Attacks  Cognitive Features That Contribute To Risk:  None    Suicide Risk:  Mild:  Suicidal ideation of limited frequency, intensity, duration, and specificity.  There are no identifiable plans, no associated intent, mild dysphoria and related symptoms, good self-control (both objective and subjective assessment), few other risk factors, and identifiable protective factors, including available and accessible social support.  Follow-up Information    Group, [facility]. Go on 11/30/2017.   Specialty:  Behavioral  Health Why:  Wednesday November 30, 2017 at 10:20am with Dr. Marlyne BeardsJennings. Contact information: [address] [place] [address] [phone]           Plan Of Care/Follow-up recommendations:  [facility]  Milea Klink R Pakou Rainbow, MD 11/24/2017, 11:06 AM

## 2018-02-04 ENCOUNTER — Encounter: Payer: Self-pay | Admitting: Family Medicine

## 2018-02-06 ENCOUNTER — Encounter: Payer: Self-pay | Admitting: Family Medicine

## 2018-02-06 DIAGNOSIS — F988 Other specified behavioral and emotional disorders with onset usually occurring in childhood and adolescence: Secondary | ICD-10-CM

## 2018-02-07 MED ORDER — AMPHETAMINE-DEXTROAMPHETAMINE 30 MG PO TABS: 30.0000 mg | ORAL_TABLET | Freq: Three times a day (TID) | ORAL | 0 refills | Status: DC

## 2018-03-28 ENCOUNTER — Ambulatory Visit (INDEPENDENT_AMBULATORY_CARE_PROVIDER_SITE_OTHER): Payer: Medicare HMO | Admitting: Family Medicine

## 2018-03-28 ENCOUNTER — Encounter: Payer: Self-pay | Admitting: Family Medicine

## 2018-03-28 ENCOUNTER — Ambulatory Visit: Payer: BLUE CROSS/BLUE SHIELD | Admitting: Family Medicine

## 2018-03-28 VITALS — BP 124/86 | HR 80 | Temp 98.5°F | Ht 69.0 in | Wt 170.0 lb

## 2018-03-28 DIAGNOSIS — Z136 Encounter for screening for cardiovascular disorders: Secondary | ICD-10-CM

## 2018-03-28 DIAGNOSIS — Z23 Encounter for immunization: Secondary | ICD-10-CM | POA: Diagnosis not present

## 2018-03-28 DIAGNOSIS — Z13220 Encounter for screening for lipoid disorders: Secondary | ICD-10-CM

## 2018-03-28 DIAGNOSIS — G479 Sleep disorder, unspecified: Secondary | ICD-10-CM | POA: Diagnosis not present

## 2018-03-28 DIAGNOSIS — Z87891 Personal history of nicotine dependence: Secondary | ICD-10-CM | POA: Diagnosis not present

## 2018-03-28 DIAGNOSIS — G43001 Migraine without aura, not intractable, with status migrainosus: Secondary | ICD-10-CM

## 2018-03-28 DIAGNOSIS — Z1211 Encounter for screening for malignant neoplasm of colon: Secondary | ICD-10-CM | POA: Diagnosis not present

## 2018-03-28 DIAGNOSIS — F341 Dysthymic disorder: Secondary | ICD-10-CM | POA: Diagnosis not present

## 2018-03-28 DIAGNOSIS — F909 Attention-deficit hyperactivity disorder, unspecified type: Secondary | ICD-10-CM | POA: Diagnosis not present

## 2018-03-28 DIAGNOSIS — Z8042 Family history of malignant neoplasm of prostate: Secondary | ICD-10-CM | POA: Diagnosis not present

## 2018-03-28 DIAGNOSIS — R69 Illness, unspecified: Secondary | ICD-10-CM | POA: Diagnosis not present

## 2018-03-28 MED ORDER — PAROXETINE HCL 40 MG PO TABS: 40.0000 mg | ORAL_TABLET | Freq: Every morning | ORAL | 3 refills | Status: DC

## 2018-03-28 MED ORDER — TRAZODONE HCL 50 MG PO TABS: 50.0000 mg | ORAL_TABLET | Freq: Every evening | ORAL | 1 refills | Status: DC | PRN

## 2018-03-28 MED ORDER — PROPRANOLOL HCL 80 MG PO TABS: ORAL_TABLET | ORAL | 3 refills | Status: DC
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
## 2018-03-29 LAB — COMPREHENSIVE METABOLIC PANEL
A/G RATIO: 2.6 — AB (ref 1.2–2.2)
ALBUMIN: 4.7 g/dL (ref 3.6–4.8)
ALK PHOS: 89 IU/L (ref 39–117)
ALT: 17 IU/L (ref 0–44)
AST: 23 IU/L (ref 0–40)
BILIRUBIN TOTAL: 0.3 mg/dL (ref 0.0–1.2)
BUN / CREAT RATIO: 16 (ref 10–24)
BUN: 14 mg/dL (ref 8–27)
CHLORIDE: 102 mmol/L (ref 96–106)
CO2: 23 mmol/L (ref 20–29)
Calcium: 9.7 mg/dL (ref 8.6–10.2)
Creatinine, Ser: 0.89 mg/dL (ref 0.76–1.27)
GFR calc Af Amer: 104 mL/min/{1.73_m2} (ref >59)
GFR calc non Af Amer: 90 mL/min/{1.73_m2} (ref >59)
GLOBULIN, TOTAL: 1.8 g/dL (ref 1.5–4.5)
GLUCOSE: 108 mg/dL — AB (ref 65–99)
Potassium: 5 mmol/L (ref 3.5–5.2)
SODIUM: 142 mmol/L (ref 134–144)
Total Protein: 6.5 g/dL (ref 6.0–8.5)

## 2018-03-29 LAB — CBC WITH DIFFERENTIAL/PLATELET
BASOS ABS: 0 10*3/uL (ref 0.0–0.2)
Basos: 1 %
EOS (ABSOLUTE): 0.2 10*3/uL (ref 0.0–0.4)
Eos: 3 %
HEMOGLOBIN: 14.1 g/dL (ref 13.0–17.7)
Hematocrit: 40.8 % (ref 37.5–51.0)
Immature Grans (Abs): 0 10*3/uL (ref 0.0–0.1)
Immature Granulocytes: 0 %
LYMPHS ABS: 2 10*3/uL (ref 0.7–3.1)
Lymphs: 32 %
MCH: 29.7 pg (ref 26.6–33.0)
MCHC: 34.6 g/dL (ref 31.5–35.7)
MCV: 86 fL (ref 79–97)
MONOCYTES: 10 %
Monocytes Absolute: 0.6 10*3/uL (ref 0.1–0.9)
Neutrophils Absolute: 3.4 10*3/uL (ref 1.4–7.0)
Neutrophils: 54 %
Platelets: 218 10*3/uL (ref 150–379)
RBC: 4.74 x10E6/uL (ref 4.14–5.80)
RDW: 12.3 % (ref 12.3–15.4)
WBC: 6.2 10*3/uL (ref 3.4–10.8)

## 2018-03-29 LAB — LIPID PANEL
CHOL/HDL RATIO: 5.9 ratio — AB (ref 0.0–5.0)
Cholesterol, Total: 202 mg/dL — ABNORMAL HIGH (ref 100–199)
HDL: 34 mg/dL — ABNORMAL LOW (ref >39)
LDL CALC: 91 mg/dL (ref 0–99)
Triglycerides: 385 mg/dL — ABNORMAL HIGH (ref 0–149)
VLDL Cholesterol Cal: 77 mg/dL — ABNORMAL HIGH (ref 5–40)

## 2018-03-29 LAB — PSA: PROSTATE SPECIFIC AG, SERUM: 2.7 ng/mL (ref 0.0–4.0)

## 2018-03-30 ENCOUNTER — Encounter: Payer: Self-pay | Admitting: Family Medicine

## 2018-04-02 ENCOUNTER — Telehealth: Payer: Self-pay | Admitting: Family Medicine

## 2018-04-02 NOTE — Telephone Encounter (Signed)
P.A. ADDERALL  

## 2018-04-03 DIAGNOSIS — Z1211 Encounter for screening for malignant neoplasm of colon: Secondary | ICD-10-CM | POA: Diagnosis not present

## 2018-04-04 ENCOUNTER — Encounter: Payer: Self-pay | Admitting: Family Medicine

## 2018-04-05 ENCOUNTER — Ambulatory Visit
Admission: RE | Admit: 2018-04-05 | Discharge: 2018-04-05 | Disposition: A | Discharge status: Home / Self Care | Payer: Medicare HMO | Source: Ambulatory Visit | Attending: Family Medicine | Admitting: Family Medicine

## 2018-04-05 DIAGNOSIS — Z87891 Personal history of nicotine dependence: Secondary | ICD-10-CM | POA: Diagnosis not present

## 2018-04-05 DIAGNOSIS — Z136 Encounter for screening for cardiovascular disorders: Secondary | ICD-10-CM | POA: Diagnosis not present

## 2018-04-05 LAB — COLOGUARD: COLOGUARD: NEGATIVE

## 2018-04-05 NOTE — Telephone Encounter (Signed)
P.A. Approved and quantity limits approved, left message for pt

## 2018-04-06 ENCOUNTER — Encounter: Payer: Self-pay | Admitting: Family Medicine

## 2018-05-10 ENCOUNTER — Encounter: Payer: Self-pay | Admitting: Family Medicine

## 2018-05-12 ENCOUNTER — Telehealth: Payer: Self-pay | Admitting: Family Medicine

## 2018-05-12 NOTE — Telephone Encounter (Signed)
Pt called and states he is having a acne flare up and would a round up of antibiotics and is wanting Doxycycline And pt uses Doxycycline  [id number] - [place], [place] - [address] pt can be reached at [phone]

## 2018-05-13 ENCOUNTER — Encounter: Payer: Self-pay | Admitting: Family Medicine

## 2018-05-13 MED ORDER — DOXYCYCLINE HYCLATE 100 MG PO TABS: 100.0000 mg | ORAL_TABLET | Freq: Two times a day (BID) | ORAL | 0 refills | Status: DC

## 2018-05-17 ENCOUNTER — Encounter: Payer: Self-pay | Admitting: Family Medicine

## 2018-05-17 LAB — COLOGUARD: COLOGUARD: NEGATIVE

## 2018-05-29 ENCOUNTER — Encounter: Payer: Self-pay | Admitting: Family Medicine

## 2018-05-30 ENCOUNTER — Other Ambulatory Visit: Payer: Self-pay | Admitting: Family Medicine

## 2018-05-30 DIAGNOSIS — G479 Sleep disorder, unspecified: Secondary | ICD-10-CM

## 2018-05-30 MED ORDER — TRAZODONE HCL 100 MG PO TABS: 100.0000 mg | ORAL_TABLET | Freq: Every evening | ORAL | 0 refills | Status: DC | PRN

## 2018-05-30 NOTE — Addendum Note (Signed)
Addended by: Ronnald NianLALONDE, JOHN C on: 05/30/2018 11:04 AM   Modules accepted: Orders

## 2018-05-30 NOTE — Telephone Encounter (Signed)
[facility]. Please advise. KH

## 2018-06-22 ENCOUNTER — Other Ambulatory Visit: Payer: Self-pay | Admitting: Family Medicine

## 2018-06-22 DIAGNOSIS — F341 Dysthymic disorder: Secondary | ICD-10-CM

## 2018-06-22 NOTE — Telephone Encounter (Signed)
[facility]  Please advise KH.

## 2018-07-30 ENCOUNTER — Encounter: Payer: Self-pay | Admitting: Family Medicine

## 2018-07-30 DIAGNOSIS — F988 Other specified behavioral and emotional disorders with onset usually occurring in childhood and adolescence: Secondary | ICD-10-CM

## 2018-07-31 MED ORDER — AMPHETAMINE-DEXTROAMPHETAMINE 30 MG PO TABS: 30.0000 mg | ORAL_TABLET | Freq: Three times a day (TID) | ORAL | 0 refills | Status: DC

## 2018-07-31 MED ORDER — ESOMEPRAZOLE MAGNESIUM 40 MG PO CPDR: 40.0000 mg | DELAYED_RELEASE_CAPSULE | Freq: Every day | ORAL | 3 refills | Status: DC

## 2018-08-01 MED ORDER — ESOMEPRAZOLE MAGNESIUM 40 MG PO CPDR: 40.0000 mg | DELAYED_RELEASE_CAPSULE | Freq: Every day | ORAL | 3 refills | Status: DC

## 2018-08-01 MED ORDER — AMPHETAMINE-DEXTROAMPHETAMINE 30 MG PO TABS
30.0000 mg | ORAL_TABLET | Freq: Three times a day (TID) | ORAL | 0 refills | Status: DC
Start: 2018-09-30 — End: 2018-08-03

## 2018-08-01 MED ORDER — AMPHETAMINE-DEXTROAMPHETAMINE 30 MG PO TABS: 30.0000 mg | ORAL_TABLET | Freq: Three times a day (TID) | ORAL | 0 refills | Status: DC

## 2018-08-01 NOTE — Addendum Note (Signed)
Addended by: Ronnald NianLALONDE, JOHN C on: 08/01/2018 12:36 PM   Modules accepted: Orders

## 2018-08-01 NOTE — Addendum Note (Signed)
Addended by: Ronnald NianLALONDE, JOHN C on: 08/01/2018 12:34 PM   Modules accepted: Orders

## 2018-08-01 NOTE — Telephone Encounter (Signed)
[facility] [address] [facility] for Adderall and Nexium

## 2018-08-03 ENCOUNTER — Telehealth: Payer: Self-pay

## 2018-08-03 DIAGNOSIS — F988 Other specified behavioral and emotional disorders with onset usually occurring in childhood and adolescence: Secondary | ICD-10-CM

## 2018-08-03 MED ORDER — AMPHETAMINE-DEXTROAMPHETAMINE 30 MG PO TABS: 30.0000 mg | ORAL_TABLET | Freq: Three times a day (TID) | ORAL | 0 refills | Status: DC

## 2018-08-03 NOTE — Telephone Encounter (Signed)
Pt adderall was np Script for 08/2018 and 10/19  Was not received . [facility]

## 2018-08-04 ENCOUNTER — Encounter: Payer: Self-pay | Admitting: Family Medicine

## 2018-08-07 ENCOUNTER — Other Ambulatory Visit: Payer: Self-pay | Admitting: Family Medicine

## 2018-08-07 ENCOUNTER — Telehealth: Payer: Self-pay

## 2018-08-07 MED ORDER — AMPHETAMINE-DEXTROAMPHETAMINE 30 MG PO TABS: 30.0000 mg | ORAL_TABLET | Freq: Three times a day (TID) | ORAL | 0 refills | Status: DC

## 2018-08-07 NOTE — Telephone Encounter (Signed)
Called pt to advise that med. Was recalled in due to may have been deleted at pharmacy per Dorene GrebeNatalie at the pharmacy. Pt was made aware . KH

## 2018-08-14 ENCOUNTER — Encounter: Payer: Self-pay | Admitting: Family Medicine

## 2018-08-27 ENCOUNTER — Other Ambulatory Visit: Payer: Self-pay | Admitting: Family Medicine

## 2018-08-29 NOTE — Telephone Encounter (Signed)
[facility]. Please advise. KH 

## 2018-08-31 DIAGNOSIS — R69 Illness, unspecified: Secondary | ICD-10-CM | POA: Diagnosis not present

## 2018-09-14 DIAGNOSIS — Z809 Family history of malignant neoplasm, unspecified: Secondary | ICD-10-CM | POA: Diagnosis not present

## 2018-09-14 DIAGNOSIS — I951 Orthostatic hypotension: Secondary | ICD-10-CM | POA: Diagnosis not present

## 2018-09-14 DIAGNOSIS — K219 Gastro-esophageal reflux disease without esophagitis: Secondary | ICD-10-CM | POA: Diagnosis not present

## 2018-09-14 DIAGNOSIS — Z7722 Contact with and (suspected) exposure to environmental tobacco smoke (acute) (chronic): Secondary | ICD-10-CM | POA: Diagnosis not present

## 2018-09-14 DIAGNOSIS — G43909 Migraine, unspecified, not intractable, without status migrainosus: Secondary | ICD-10-CM | POA: Diagnosis not present

## 2018-09-14 DIAGNOSIS — R030 Elevated blood-pressure reading, without diagnosis of hypertension: Secondary | ICD-10-CM | POA: Diagnosis not present

## 2018-09-14 DIAGNOSIS — F909 Attention-deficit hyperactivity disorder, unspecified type: Secondary | ICD-10-CM | POA: Diagnosis not present

## 2018-09-14 DIAGNOSIS — Z87891 Personal history of nicotine dependence: Secondary | ICD-10-CM | POA: Diagnosis not present

## 2018-09-14 DIAGNOSIS — G4700 Insomnia, unspecified: Secondary | ICD-10-CM | POA: Diagnosis not present

## 2018-09-14 DIAGNOSIS — R69 Illness, unspecified: Secondary | ICD-10-CM | POA: Diagnosis not present

## 2018-10-14 ENCOUNTER — Other Ambulatory Visit: Payer: Self-pay | Admitting: Family Medicine

## 2018-10-16 ENCOUNTER — Encounter: Payer: Self-pay | Admitting: Family Medicine

## 2018-10-16 MED ORDER — AMPHETAMINE-DEXTROAMPHETAMINE 30 MG PO TABS: 30.0000 mg | ORAL_TABLET | Freq: Three times a day (TID) | ORAL | 0 refills | Status: DC

## 2018-10-16 NOTE — Telephone Encounter (Signed)
My notes say that he could have picked up a prescription on October 5.  Not sure whether sending this through this early.  See what the pharmacy has to say

## 2018-10-16 NOTE — Telephone Encounter (Signed)
[facility]. Please advise KH 

## 2018-10-16 NOTE — Telephone Encounter (Signed)
Called pharmacy and they don't have a script for October on file at all. Pharmacy also stated that last script picked up was 9/04-2018. Called pt to ask more questions but no answer LVM . KH

## 2018-10-16 NOTE — Progress Notes (Signed)
The original prescription was written for 10/5 but apparently the pharmacy did not give it.  I did call in a new prescription for today.

## 2018-10-16 NOTE — Telephone Encounter (Signed)
Please advise pt has not called back to advise of any other info. Dakota Gray

## 2018-10-26 ENCOUNTER — Other Ambulatory Visit (INDEPENDENT_AMBULATORY_CARE_PROVIDER_SITE_OTHER): Payer: Medicare HMO

## 2018-10-26 DIAGNOSIS — Z23 Encounter for immunization: Secondary | ICD-10-CM

## 2018-10-28 ENCOUNTER — Other Ambulatory Visit: Payer: Self-pay | Admitting: Family Medicine

## 2018-11-01 ENCOUNTER — Ambulatory Visit (INDEPENDENT_AMBULATORY_CARE_PROVIDER_SITE_OTHER): Payer: Medicare HMO | Admitting: Family Medicine

## 2018-11-01 ENCOUNTER — Encounter: Payer: Self-pay | Admitting: Family Medicine

## 2018-11-01 VITALS — BP 120/84 | HR 84 | Temp 98.5°F | Wt 177.4 lb

## 2018-11-01 DIAGNOSIS — F988 Other specified behavioral and emotional disorders with onset usually occurring in childhood and adolescence: Secondary | ICD-10-CM | POA: Diagnosis not present

## 2018-11-01 DIAGNOSIS — R69 Illness, unspecified: Secondary | ICD-10-CM | POA: Diagnosis not present

## 2018-11-01 DIAGNOSIS — F341 Dysthymic disorder: Secondary | ICD-10-CM | POA: Diagnosis not present

## 2018-11-01 MED ORDER — ALPRAZOLAM 0.5 MG PO TABS: 0.5000 mg | ORAL_TABLET | Freq: Two times a day (BID) | ORAL | 0 refills | Status: DC | PRN

## 2018-11-01 MED ORDER — BUPROPION HCL ER (XL) 150 MG PO TB24: 150.0000 mg | ORAL_TABLET | Freq: Every day | ORAL | 1 refills | Status: DC
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
## 2018-11-01 NOTE — Patient Instructions (Signed)
Call [facility]

## 2018-11-02 ENCOUNTER — Encounter: Payer: Medicare HMO | Admitting: Family Medicine

## 2018-11-24 ENCOUNTER — Other Ambulatory Visit: Payer: Self-pay | Admitting: Family Medicine

## 2018-11-24 DIAGNOSIS — F341 Dysthymic disorder: Secondary | ICD-10-CM

## 2018-11-27 NOTE — Telephone Encounter (Signed)
[facility] well[facility]rin. Please advise KH 

## 2018-12-07 ENCOUNTER — Encounter: Payer: Self-pay | Admitting: Family Medicine

## 2018-12-07 ENCOUNTER — Ambulatory Visit (INDEPENDENT_AMBULATORY_CARE_PROVIDER_SITE_OTHER): Payer: Medicare HMO | Admitting: Family Medicine

## 2018-12-07 VITALS — BP 130/90 | HR 88 | Temp 98.0°F | Wt 183.8 lb

## 2018-12-07 DIAGNOSIS — F988 Other specified behavioral and emotional disorders with onset usually occurring in childhood and adolescence: Secondary | ICD-10-CM

## 2018-12-07 DIAGNOSIS — R69 Illness, unspecified: Secondary | ICD-10-CM | POA: Diagnosis not present

## 2018-12-07 DIAGNOSIS — L299 Pruritus, unspecified: Secondary | ICD-10-CM

## 2018-12-07 DIAGNOSIS — F341 Dysthymic disorder: Secondary | ICD-10-CM

## 2018-12-07 MED ORDER — ALPRAZOLAM 1 MG PO TABS: 1.0000 mg | ORAL_TABLET | Freq: Two times a day (BID) | ORAL | 0 refills | Status: DC | PRN
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
## 2018-12-07 NOTE — Patient Instructions (Signed)
Double the dosing of the Wellbutrin and call me in a month

## 2018-12-08 ENCOUNTER — Other Ambulatory Visit: Payer: Self-pay | Admitting: Family Medicine

## 2018-12-08 DIAGNOSIS — F341 Dysthymic disorder: Secondary | ICD-10-CM

## 2018-12-11 NOTE — Telephone Encounter (Signed)
[facility]anax. Please advise KH 

## 2018-12-12 MED ORDER — ALPRAZOLAM 1 MG PO TABS: 1.0000 mg | ORAL_TABLET | Freq: Two times a day (BID) | ORAL | 0 refills | Status: DC | PRN

## 2019-01-16 ENCOUNTER — Encounter: Payer: Self-pay | Admitting: Family Medicine

## 2019-01-16 DIAGNOSIS — F341 Dysthymic disorder: Secondary | ICD-10-CM

## 2019-01-19 MED ORDER — ALPRAZOLAM 1 MG PO TABS: 1.0000 mg | ORAL_TABLET | Freq: Two times a day (BID) | ORAL | 0 refills | Status: DC | PRN

## 2019-02-06 ENCOUNTER — Encounter: Payer: Self-pay | Admitting: Family Medicine

## 2019-02-07 MED ORDER — HYDROXYZINE HCL 25 MG PO TABS: 25.0000 mg | ORAL_TABLET | Freq: Three times a day (TID) | ORAL | 0 refills | Status: DC | PRN

## 2019-02-11 ENCOUNTER — Encounter: Payer: Self-pay | Admitting: Family Medicine

## 2019-02-11 MED ORDER — HYDROXYZINE HCL 25 MG PO TABS: 25.0000 mg | ORAL_TABLET | Freq: Three times a day (TID) | ORAL | 0 refills | Status: DC | PRN

## 2019-02-11 MED ORDER — TRAZODONE HCL 100 MG PO TABS: 100.0000 mg | ORAL_TABLET | Freq: Every evening | ORAL | 1 refills | Status: DC | PRN

## 2019-02-11 NOTE — Addendum Note (Signed)
Addended by: Ronnald Nian on: 02/11/2019 08:54 PM   Modules accepted: Orders

## 2019-02-12 ENCOUNTER — Encounter: Payer: Self-pay | Admitting: Family Medicine

## 2019-02-13 NOTE — Telephone Encounter (Signed)
Going to his record and eliminate all the other pharmacies.

## 2019-02-16 ENCOUNTER — Encounter: Payer: Self-pay | Admitting: Family Medicine

## 2019-02-16 DIAGNOSIS — F341 Dysthymic disorder: Secondary | ICD-10-CM

## 2019-02-18 ENCOUNTER — Telehealth: Payer: Self-pay | Admitting: Family Medicine

## 2019-02-18 NOTE — Telephone Encounter (Signed)
P.A. HYDROXYZINE  °

## 2019-02-19 MED ORDER — ALPRAZOLAM 1 MG PO TABS: 1.0000 mg | ORAL_TABLET | Freq: Two times a day (BID) | ORAL | 0 refills | Status: DC | PRN

## 2019-02-20 NOTE — Telephone Encounter (Signed)
P.A. approved til 12/27/19, pt informed

## 2019-02-26 ENCOUNTER — Encounter: Payer: Self-pay | Admitting: Family Medicine

## 2019-02-26 DIAGNOSIS — F988 Other specified behavioral and emotional disorders with onset usually occurring in childhood and adolescence: Secondary | ICD-10-CM

## 2019-02-27 MED ORDER — AMPHETAMINE-DEXTROAMPHETAMINE 30 MG PO TABS: 30.0000 mg | ORAL_TABLET | Freq: Three times a day (TID) | ORAL | 0 refills | Status: DC

## 2019-02-27 MED ORDER — HYDROXYZINE HCL 10 MG PO TABS: 10.0000 mg | ORAL_TABLET | Freq: Three times a day (TID) | ORAL | 1 refills | Status: DC | PRN

## 2019-03-02 NOTE — Telephone Encounter (Signed)
Done

## 2019-03-05 ENCOUNTER — Telehealth: Payer: Self-pay | Admitting: Family Medicine

## 2019-03-05 ENCOUNTER — Encounter: Payer: Self-pay | Admitting: Family Medicine

## 2019-03-05 NOTE — Telephone Encounter (Signed)
P.A. DEXTROAMPHETAMINE-AMPHET quantity limits exception

## 2019-03-11 ENCOUNTER — Encounter: Payer: Self-pay | Admitting: Family Medicine

## 2019-03-12 ENCOUNTER — Telehealth: Payer: Self-pay | Admitting: Family Medicine

## 2019-03-12 ENCOUNTER — Encounter: Payer: Self-pay | Admitting: Family Medicine

## 2019-03-12 DIAGNOSIS — F341 Dysthymic disorder: Secondary | ICD-10-CM

## 2019-03-12 NOTE — Telephone Encounter (Signed)
Pt states he didn't receive the my chart message it was blank.  Explained Dr. Jola Babinski instructions and he states no cough or sore throat or SOB and no known fever.

## 2019-03-14 MED ORDER — ALPRAZOLAM 1 MG PO TABS: 1.0000 mg | ORAL_TABLET | Freq: Two times a day (BID) | ORAL | 0 refills | Status: DC | PRN

## 2019-03-17 NOTE — Telephone Encounter (Signed)
P.A. approved til 12/27/19, pt informed  °

## 2019-03-22 ENCOUNTER — Encounter: Payer: Self-pay | Admitting: Family Medicine

## 2019-03-23 ENCOUNTER — Other Ambulatory Visit: Payer: Self-pay | Admitting: Family Medicine

## 2019-03-23 NOTE — Telephone Encounter (Signed)
Is this ok to refill?  

## 2019-03-23 NOTE — Telephone Encounter (Signed)
You may want to send this to Dr. Susann Givens. I am ok to give him 30 days but not as many as he has been getting.

## 2019-03-23 NOTE — Telephone Encounter (Signed)
Is this okay to refill? 

## 2019-03-27 ENCOUNTER — Encounter: Payer: Self-pay | Admitting: Family Medicine

## 2019-03-30 ENCOUNTER — Other Ambulatory Visit: Payer: Self-pay | Admitting: Family Medicine

## 2019-03-30 NOTE — Telephone Encounter (Signed)
Is this ok to refill?  

## 2019-04-16 ENCOUNTER — Encounter: Payer: Self-pay | Admitting: Family Medicine

## 2019-04-17 MED ORDER — PROMETHAZINE HCL 25 MG PO TABS: 25.0000 mg | ORAL_TABLET | Freq: Three times a day (TID) | ORAL | 0 refills | Status: DC | PRN

## 2019-04-17 NOTE — Telephone Encounter (Signed)
He would like a refill on promethazine for nausea.  I will give him 30.  He asked for 60 but I will only give 30.  I will explained to him that if he needs that number, I would need to see him.

## 2019-04-20 MED ORDER — PROMETHAZINE HCL 25 MG PO TABS: 25.0000 mg | ORAL_TABLET | Freq: Three times a day (TID) | ORAL | 0 refills | Status: DC | PRN

## 2019-04-20 NOTE — Addendum Note (Signed)
Addended by: Herminio Commons A on: 04/20/2019 08:29 AM   Modules accepted: Orders

## 2019-04-25 ENCOUNTER — Other Ambulatory Visit: Payer: Self-pay | Admitting: Family Medicine

## 2019-04-25 NOTE — Telephone Encounter (Signed)
Is this okay to refill? 

## 2019-04-28 ENCOUNTER — Other Ambulatory Visit: Payer: Self-pay | Admitting: Family Medicine

## 2019-04-30 NOTE — Telephone Encounter (Signed)
[facility] 90 day supply of Hydroxyzine. Please advise [facility]

## 2019-05-12 ENCOUNTER — Telehealth: Payer: Self-pay | Admitting: Family Medicine

## 2019-05-12 NOTE — Telephone Encounter (Signed)
P.A. Eston Mould

## 2019-05-12 NOTE — Telephone Encounter (Signed)
P.A. approved til 12/27/19, pharmacy informed

## 2019-05-14 ENCOUNTER — Other Ambulatory Visit: Payer: Self-pay | Admitting: Family Medicine

## 2019-05-14 DIAGNOSIS — F341 Dysthymic disorder: Secondary | ICD-10-CM

## 2019-05-14 NOTE — Telephone Encounter (Signed)
[facility] pt paxil. Dakota Gray

## 2019-06-02 ENCOUNTER — Encounter: Payer: Self-pay | Admitting: Family Medicine

## 2019-06-02 DIAGNOSIS — F341 Dysthymic disorder: Secondary | ICD-10-CM

## 2019-06-04 MED ORDER — ALPRAZOLAM 1 MG PO TABS: 1.0000 mg | ORAL_TABLET | Freq: Two times a day (BID) | ORAL | 1 refills | Status: DC | PRN

## 2019-06-19 ENCOUNTER — Encounter: Payer: Self-pay | Admitting: Family Medicine

## 2019-06-20 MED ORDER — CLOBETASOL PROPIONATE 0.05 % EX CREA: 1.0000 "application " | TOPICAL_CREAM | Freq: Two times a day (BID) | CUTANEOUS | 5 refills | Status: AC

## 2019-06-25 ENCOUNTER — Encounter: Payer: Self-pay | Admitting: Family Medicine

## 2019-07-19 ENCOUNTER — Telehealth: Payer: Self-pay

## 2019-07-19 ENCOUNTER — Encounter: Payer: Self-pay | Admitting: Family Medicine

## 2019-07-19 ENCOUNTER — Ambulatory Visit (INDEPENDENT_AMBULATORY_CARE_PROVIDER_SITE_OTHER): Payer: Medicare HMO | Admitting: Family Medicine

## 2019-07-19 ENCOUNTER — Other Ambulatory Visit: Payer: Self-pay

## 2019-07-19 VITALS — BP 118/76 | HR 84 | Temp 98.4°F | Wt 185.0 lb

## 2019-07-19 DIAGNOSIS — Z8042 Family history of malignant neoplasm of prostate: Secondary | ICD-10-CM

## 2019-07-19 DIAGNOSIS — G43001 Migraine without aura, not intractable, with status migrainosus: Secondary | ICD-10-CM | POA: Diagnosis not present

## 2019-07-19 DIAGNOSIS — F341 Dysthymic disorder: Secondary | ICD-10-CM | POA: Diagnosis not present

## 2019-07-19 DIAGNOSIS — Z23 Encounter for immunization: Secondary | ICD-10-CM

## 2019-07-19 DIAGNOSIS — F909 Attention-deficit hyperactivity disorder, unspecified type: Secondary | ICD-10-CM

## 2019-07-19 DIAGNOSIS — K219 Gastro-esophageal reflux disease without esophagitis: Secondary | ICD-10-CM

## 2019-07-19 DIAGNOSIS — F332 Major depressive disorder, recurrent severe without psychotic features: Secondary | ICD-10-CM

## 2019-07-19 DIAGNOSIS — E785 Hyperlipidemia, unspecified: Secondary | ICD-10-CM

## 2019-07-19 DIAGNOSIS — R69 Illness, unspecified: Secondary | ICD-10-CM | POA: Diagnosis not present

## 2019-07-19 DIAGNOSIS — E7439 Other disorders of intestinal carbohydrate absorption: Secondary | ICD-10-CM

## 2019-07-19 DIAGNOSIS — R7309 Other abnormal glucose: Secondary | ICD-10-CM | POA: Diagnosis not present

## 2019-07-19 DIAGNOSIS — Z125 Encounter for screening for malignant neoplasm of prostate: Secondary | ICD-10-CM | POA: Diagnosis not present

## 2019-07-19 DIAGNOSIS — F988 Other specified behavioral and emotional disorders with onset usually occurring in childhood and adolescence: Secondary | ICD-10-CM

## 2019-07-19 MED ORDER — TRAZODONE HCL 150 MG PO TABS: 150.0000 mg | ORAL_TABLET | Freq: Every evening | ORAL | 1 refills | Status: DC | PRN

## 2019-07-19 MED ORDER — RANITIDINE HCL 300 MG PO TABS: 300.0000 mg | ORAL_TABLET | Freq: Every day | ORAL | 1 refills | Status: DC

## 2019-07-19 MED ORDER — AMPHETAMINE-DEXTROAMPHETAMINE 30 MG PO TABS: 30.0000 mg | ORAL_TABLET | Freq: Three times a day (TID) | ORAL | 0 refills | Status: DC

## 2019-07-19 MED ORDER — ALPRAZOLAM 1 MG PO TABS: 1.0000 mg | ORAL_TABLET | Freq: Two times a day (BID) | ORAL | 1 refills | Status: DC | PRN

## 2019-07-19 MED ORDER — PAROXETINE HCL 40 MG PO TABS: ORAL_TABLET | ORAL | 3 refills | Status: DC

## 2019-07-19 MED ORDER — PROPRANOLOL HCL 80 MG PO TABS: ORAL_TABLET | ORAL | 3 refills | Status: DC

## 2019-07-19 NOTE — Telephone Encounter (Signed)
Pt wanted to advise that he needs Xanax and we got a fax  [facility] ran[facility]. Please advise [facility]
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
## 2019-07-20 LAB — COMPREHENSIVE METABOLIC PANEL
ALT: 30 IU/L (ref 0–44)
AST: 26 IU/L (ref 0–40)
Albumin/Globulin Ratio: 2.6 — ABNORMAL HIGH (ref 1.2–2.2)
Albumin: 4.7 g/dL (ref 3.8–4.8)
Alkaline Phosphatase: 77 IU/L (ref 39–117)
BUN/Creatinine Ratio: 13 (ref 10–24)
BUN: 14 mg/dL (ref 8–27)
Bilirubin Total: 0.5 mg/dL (ref 0.0–1.2)
CO2: 18 mmol/L — ABNORMAL LOW (ref 20–29)
Calcium: 9.4 mg/dL (ref 8.6–10.2)
Chloride: 104 mmol/L (ref 96–106)
Creatinine, Ser: 1.06 mg/dL (ref 0.76–1.27)
GFR calc Af Amer: 84 mL/min/{1.73_m2} (ref >59)
GFR calc non Af Amer: 73 mL/min/{1.73_m2} (ref >59)
Globulin, Total: 1.8 g/dL (ref 1.5–4.5)
Glucose: 163 mg/dL — ABNORMAL HIGH (ref 65–99)
Potassium: 4.8 mmol/L (ref 3.5–5.2)
Sodium: 140 mmol/L (ref 134–144)
Total Protein: 6.5 g/dL (ref 6.0–8.5)

## 2019-07-20 LAB — CBC WITH DIFFERENTIAL/PLATELET
Basophils Absolute: 0 10*3/uL (ref 0.0–0.2)
Basos: 1 %
EOS (ABSOLUTE): 0.2 10*3/uL (ref 0.0–0.4)
Eos: 2 %
Hematocrit: 45.2 % (ref 37.5–51.0)
Hemoglobin: 15.3 g/dL (ref 13.0–17.7)
Immature Grans (Abs): 0 10*3/uL (ref 0.0–0.1)
Immature Granulocytes: 1 %
Lymphocytes Absolute: 2 10*3/uL (ref 0.7–3.1)
Lymphs: 32 %
MCH: 29 pg (ref 26.6–33.0)
MCHC: 33.8 g/dL (ref 31.5–35.7)
MCV: 86 fL (ref 79–97)
Monocytes Absolute: 0.6 10*3/uL (ref 0.1–0.9)
Monocytes: 9 %
Neutrophils Absolute: 3.4 10*3/uL (ref 1.4–7.0)
Neutrophils: 55 %
Platelets: 224 10*3/uL (ref 150–450)
RBC: 5.28 x10E6/uL (ref 4.14–5.80)
RDW: 13.2 % (ref 11.6–15.4)
WBC: 6.2 10*3/uL (ref 3.4–10.8)

## 2019-07-20 LAB — LIPID PANEL
Chol/HDL Ratio: 5.3 ratio — ABNORMAL HIGH (ref 0.0–5.0)
Cholesterol, Total: 227 mg/dL — ABNORMAL HIGH (ref 100–199)
HDL: 43 mg/dL (ref >39)
LDL Calculated: 138 mg/dL — ABNORMAL HIGH (ref 0–99)
Triglycerides: 231 mg/dL — ABNORMAL HIGH (ref 0–149)
VLDL Cholesterol Cal: 46 mg/dL — ABNORMAL HIGH (ref 5–40)

## 2019-07-20 LAB — PSA: Prostate Specific Ag, Serum: 2.5 ng/mL (ref 0.0–4.0)

## 2019-07-26 DIAGNOSIS — E119 Type 2 diabetes mellitus without complications: Secondary | ICD-10-CM | POA: Insufficient documentation

## 2019-07-26 DIAGNOSIS — E7439 Other disorders of intestinal carbohydrate absorption: Secondary | ICD-10-CM | POA: Insufficient documentation

## 2019-07-26 LAB — SPECIMEN STATUS REPORT

## 2019-07-26 LAB — HGB A1C W/O EAG: Hgb A1c MFr Bld: 6.6 % — ABNORMAL HIGH (ref 4.8–5.6)

## 2019-08-13 ENCOUNTER — Encounter: Payer: Self-pay | Admitting: Family Medicine

## 2019-08-15 ENCOUNTER — Encounter: Payer: Self-pay | Admitting: Family Medicine

## 2019-08-15 DIAGNOSIS — F988 Other specified behavioral and emotional disorders with onset usually occurring in childhood and adolescence: Secondary | ICD-10-CM

## 2019-08-17 MED ORDER — AMPHETAMINE-DEXTROAMPHETAMINE 30 MG PO TABS: 30.0000 mg | ORAL_TABLET | Freq: Three times a day (TID) | ORAL | 0 refills | Status: DC

## 2019-09-22 ENCOUNTER — Encounter: Payer: Self-pay | Admitting: Family Medicine

## 2019-09-22 DIAGNOSIS — F341 Dysthymic disorder: Secondary | ICD-10-CM

## 2019-09-24 MED ORDER — ALPRAZOLAM 1 MG PO TABS: 1.0000 mg | ORAL_TABLET | Freq: Two times a day (BID) | ORAL | 1 refills | Status: DC | PRN

## 2019-09-28 ENCOUNTER — Encounter: Payer: Self-pay | Admitting: Family Medicine

## 2019-10-02 ENCOUNTER — Other Ambulatory Visit (INDEPENDENT_AMBULATORY_CARE_PROVIDER_SITE_OTHER): Payer: Medicare HMO

## 2019-10-02 ENCOUNTER — Other Ambulatory Visit: Payer: Self-pay

## 2019-10-02 DIAGNOSIS — Z23 Encounter for immunization: Secondary | ICD-10-CM | POA: Diagnosis not present

## 2019-11-06 DIAGNOSIS — H52223 Regular astigmatism, bilateral: Secondary | ICD-10-CM | POA: Diagnosis not present

## 2019-11-06 DIAGNOSIS — H5211 Myopia, right eye: Secondary | ICD-10-CM | POA: Diagnosis not present

## 2019-11-06 DIAGNOSIS — H524 Presbyopia: Secondary | ICD-10-CM | POA: Diagnosis not present

## 2019-11-07 ENCOUNTER — Other Ambulatory Visit: Payer: Self-pay | Admitting: Family Medicine

## 2019-11-07 DIAGNOSIS — F988 Other specified behavioral and emotional disorders with onset usually occurring in childhood and adolescence: Secondary | ICD-10-CM

## 2019-11-07 MED ORDER — AMPHETAMINE-DEXTROAMPHETAMINE 30 MG PO TABS: 30.0000 mg | ORAL_TABLET | Freq: Three times a day (TID) | ORAL | 0 refills | Status: DC

## 2019-11-07 NOTE — Telephone Encounter (Signed)
[facility] is requesting to fill pt adderall. Please advise KH 

## 2019-12-30 ENCOUNTER — Other Ambulatory Visit: Payer: Self-pay | Admitting: Family Medicine

## 2019-12-30 DIAGNOSIS — F332 Major depressive disorder, recurrent severe without psychotic features: Secondary | ICD-10-CM

## 2019-12-31 NOTE — Telephone Encounter (Signed)
[facility]. Please advise KH 

## 2020-01-15 ENCOUNTER — Encounter: Payer: Self-pay | Admitting: Family Medicine

## 2020-01-15 DIAGNOSIS — F341 Dysthymic disorder: Secondary | ICD-10-CM

## 2020-01-17 MED ORDER — ALPRAZOLAM 1 MG PO TABS: 1.0000 mg | ORAL_TABLET | Freq: Two times a day (BID) | ORAL | 1 refills | Status: DC | PRN

## 2020-02-11 ENCOUNTER — Encounter: Payer: Self-pay | Admitting: Family Medicine

## 2020-02-12 ENCOUNTER — Telehealth: Payer: Self-pay

## 2020-02-12 NOTE — Telephone Encounter (Signed)
I submitted a PA for the pts. Adderall and it was approved from 12/28/19-12/26/20.

## 2020-02-18 ENCOUNTER — Encounter: Payer: Self-pay | Admitting: Family Medicine

## 2020-02-18 DIAGNOSIS — F988 Other specified behavioral and emotional disorders with onset usually occurring in childhood and adolescence: Secondary | ICD-10-CM

## 2020-02-19 MED ORDER — AMPHETAMINE-DEXTROAMPHETAMINE 30 MG PO TABS: 30.0000 mg | ORAL_TABLET | Freq: Three times a day (TID) | ORAL | 0 refills | Status: DC

## 2020-02-25 ENCOUNTER — Ambulatory Visit: Payer: Medicare HMO | Attending: Internal Medicine

## 2020-02-25 ENCOUNTER — Ambulatory Visit: Payer: Self-pay

## 2020-02-25 DIAGNOSIS — Z23 Encounter for immunization: Secondary | ICD-10-CM | POA: Insufficient documentation

## 2020-02-25 NOTE — Progress Notes (Signed)
   Covid-19 Vaccination Clinic  Name:  Daniele Yankowski    MRN: [id number] DOB: 02-05-1951  02/25/2020  Mr. Talmadge was observed post Covid-19 immunization for 15 minutes without incidence. He was provided with Vaccine Information Sheet and instruction to access the V-Safe system.   Mr. Hyland was instructed to call 911 with any severe reactions post vaccine: Marland Kitchen Difficulty breathing  . Swelling of your face and throat  . A fast heartbeat  . A bad rash all over your body  . Dizziness and weakness    Immunizations Administered    Name Date Dose VIS Date Route   Pfizer COVID-19 Vaccine 02/25/2020 11:39 AM 0.3 mL 12/07/2019 Intramuscular   Manufacturer: [employer], [employer]   Lot: [id number]   NDC: [address]-3

## 2020-03-02 ENCOUNTER — Other Ambulatory Visit: Payer: Self-pay | Admitting: Family Medicine

## 2020-03-02 DIAGNOSIS — K219 Gastro-esophageal reflux disease without esophagitis: Secondary | ICD-10-CM

## 2020-03-06 IMAGING — US US ABDOMINAL AORTA SCREENING AAA
1 series · 14 of 15 positions shown · non-contrast
Comparison: None.

CLINICAL DATA: A 67, former smoker

EXAM:
ULTRASOUND OF ABDOMINAL AORTA
TECHNIQUE: Ultrasound examination of the abdominal aorta was performed to
evaluate for abdominal aortic aneurysm.

[Series 1: us abdominal aorta screening aaa · 0.27mm/px · 14 of 15 slices shown]
[im 1/15]
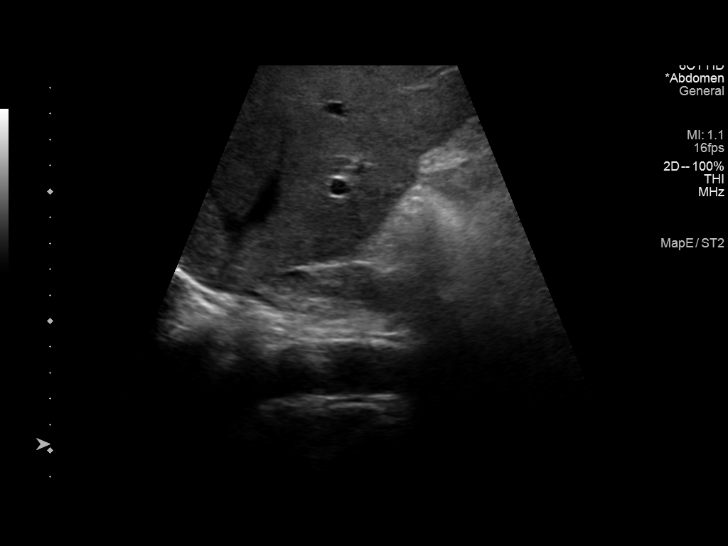
[im 2/15]
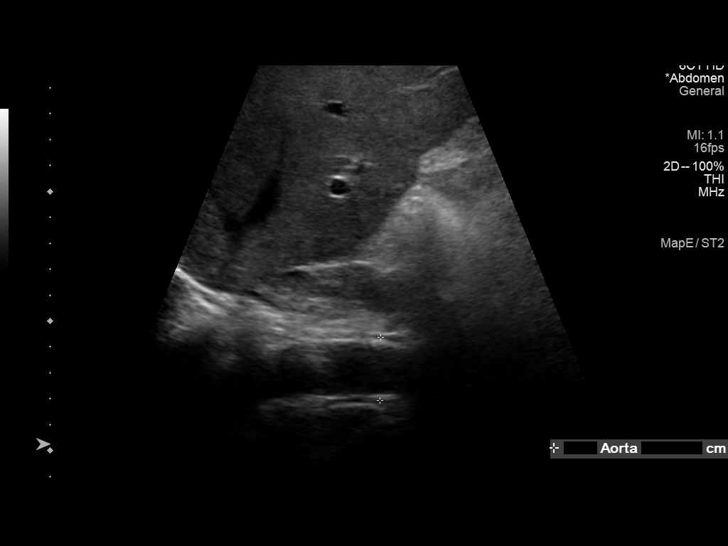
[im 3/15]
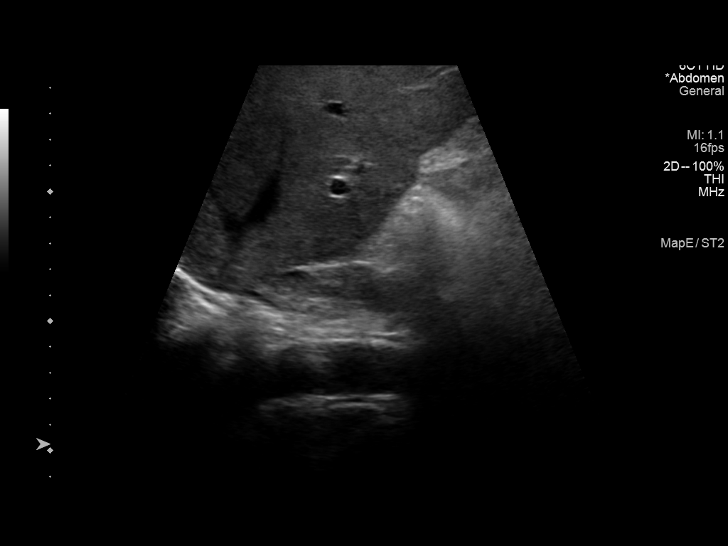
[im 4/15]
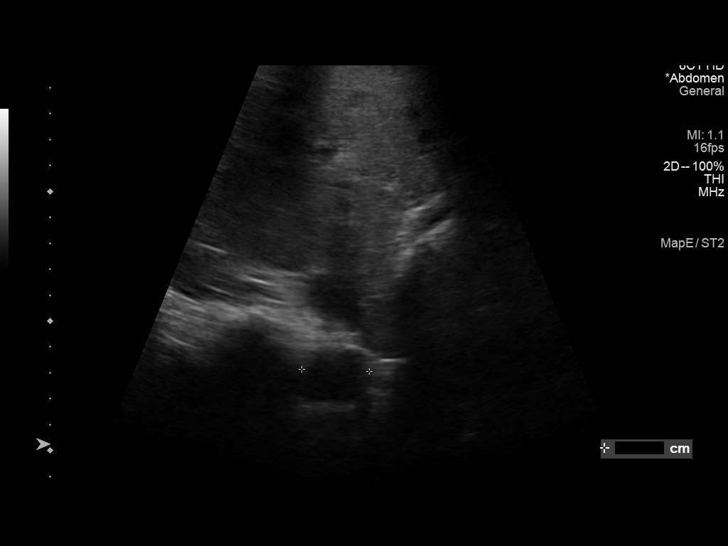
[im 5/15]
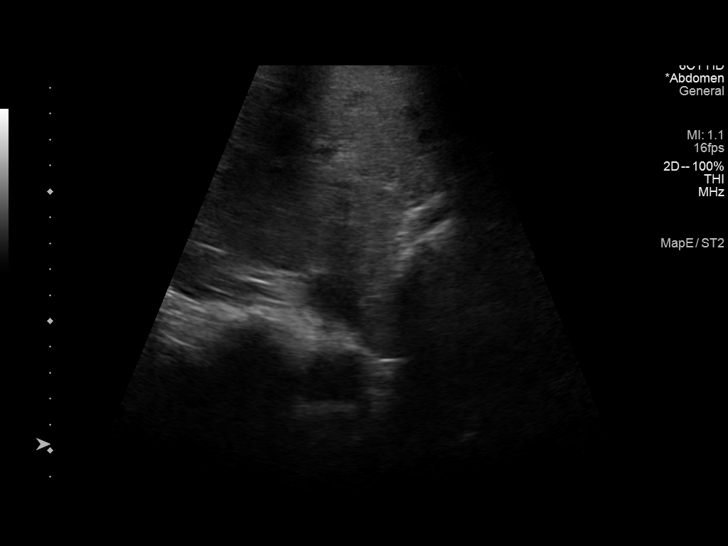
[im 6/15]
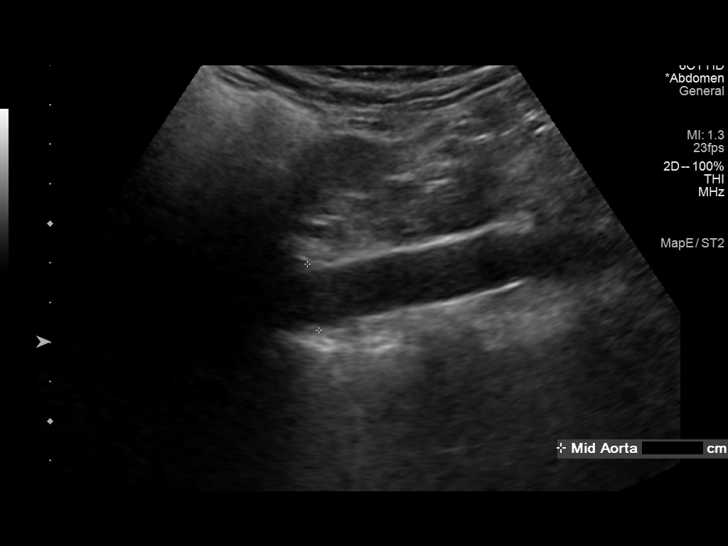
[im 7/15]
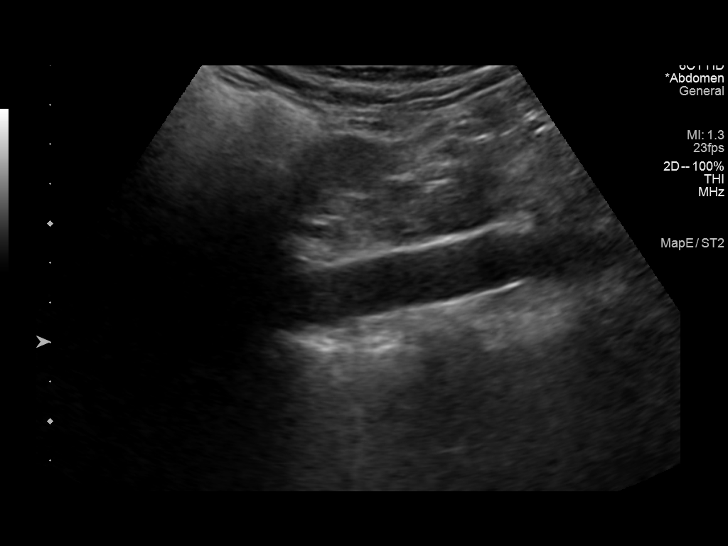
[im 9/15]
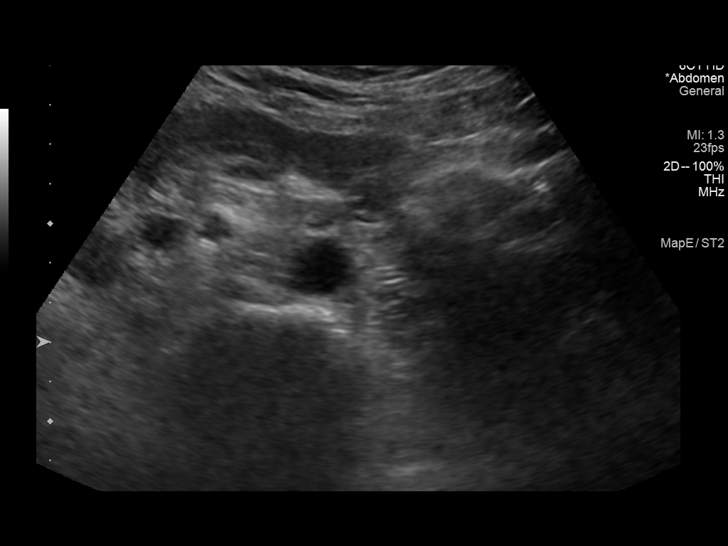
[im 10/15]
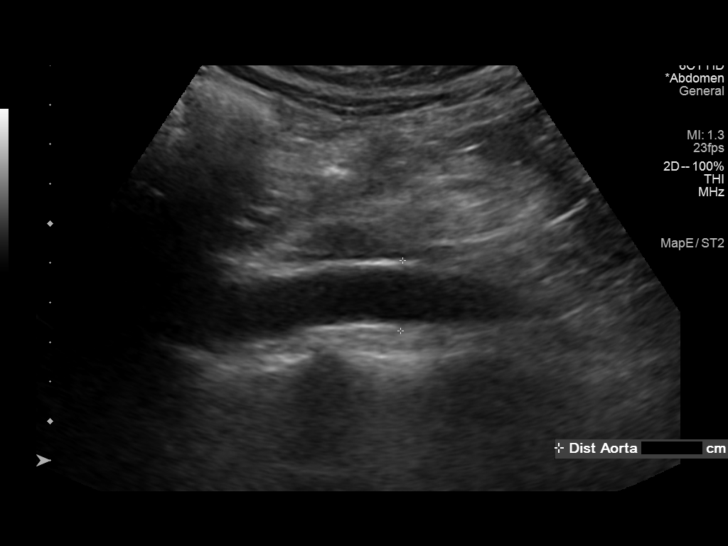
[im 11/15]
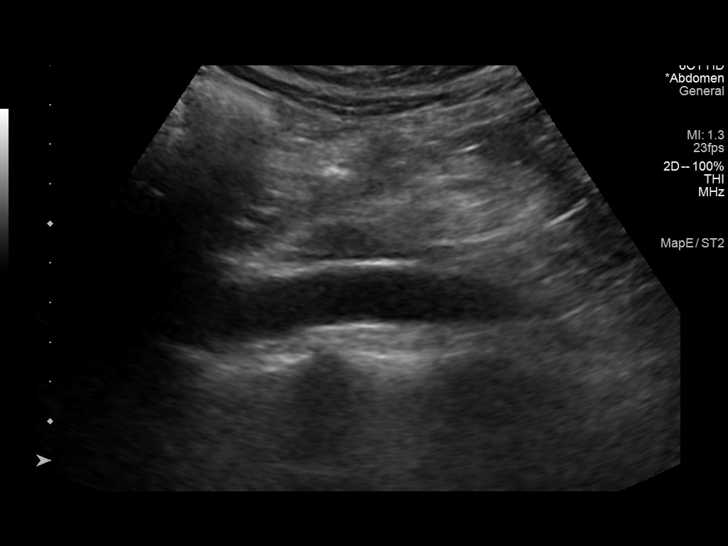
[im 12/15]
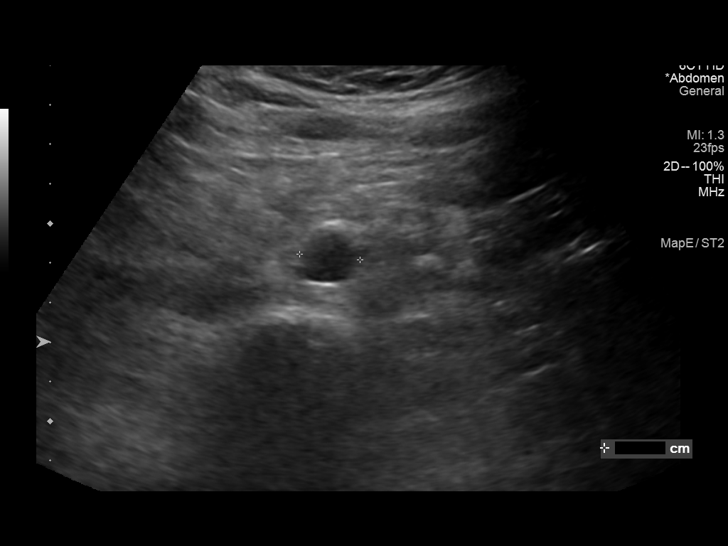
[im 13/15]
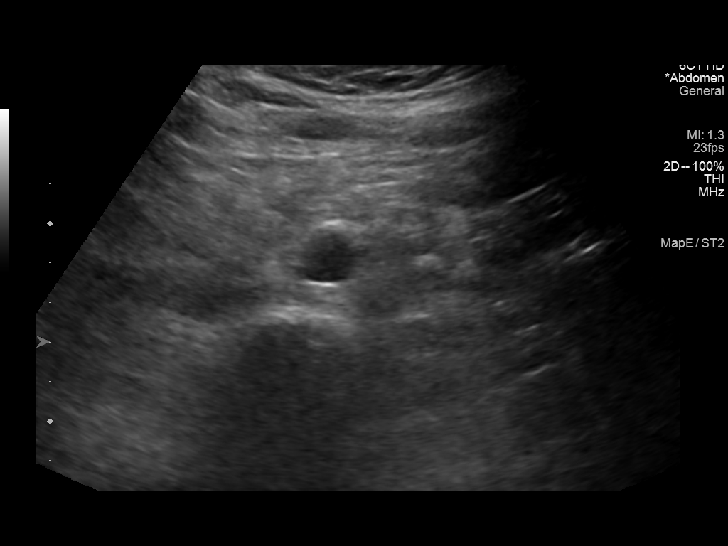
[im 14/15]
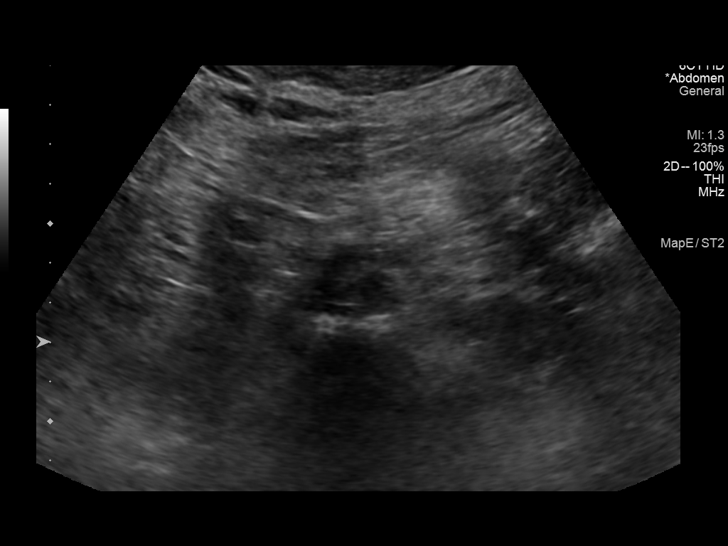
[im 15/15]
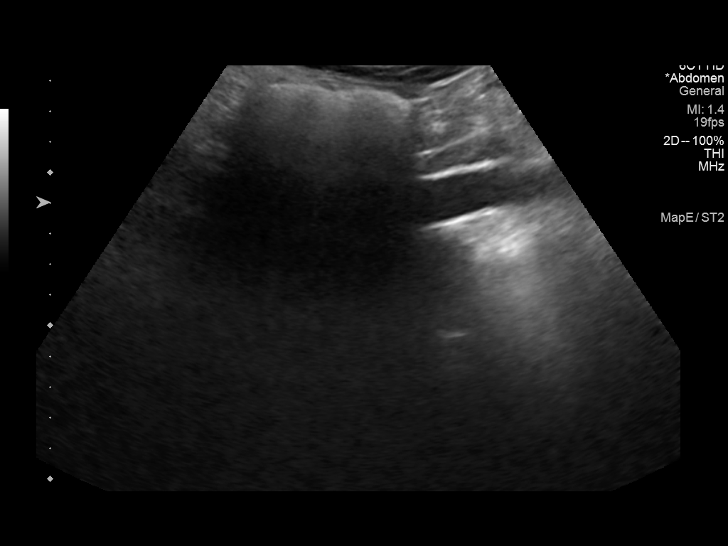

[14 of 15 positions shown; findings below may reference images not displayed]

FINDINGS: Abdominal aortic measurements as follows:

Proximal:  2.6 cm

Mid:  1.7 cm

Distal:  1.8 cm
IMPRESSION: No aortic aneurysm.

## 2020-03-25 ENCOUNTER — Ambulatory Visit: Payer: Medicare HMO | Attending: Internal Medicine

## 2020-03-25 DIAGNOSIS — Z23 Encounter for immunization: Secondary | ICD-10-CM

## 2020-03-25 NOTE — Progress Notes (Signed)
   Covid-19 Vaccination Clinic  Name:  Saunders Arlington    MRN: [id number] DOB: 1951/02/22  03/25/2020  Mr. Stehr was observed post Covid-19 immunization for 15 minutes without incident. He was provided with Vaccine Information Sheet and instruction to access the V-Safe system.   Mr. Tennis was instructed to call 911 with any severe reactions post vaccine: Marland Kitchen Difficulty breathing  . Swelling of face and throat  . A fast heartbeat  . A bad rash all over body  . Dizziness and weakness   Immunizations Administered    Name Date Dose VIS Date Route   Pfizer COVID-19 Vaccine 03/25/2020  4:14 PM 0.3 mL 12/07/2019 Intramuscular   Manufacturer: [employer], [employer]   Lot: [id number]   NDC: [address]-6

## 2020-05-10 ENCOUNTER — Encounter: Payer: Self-pay | Admitting: Family Medicine

## 2020-05-10 DIAGNOSIS — F988 Other specified behavioral and emotional disorders with onset usually occurring in childhood and adolescence: Secondary | ICD-10-CM

## 2020-05-12 MED ORDER — AMPHETAMINE-DEXTROAMPHETAMINE 30 MG PO TABS: 30.0000 mg | ORAL_TABLET | Freq: Three times a day (TID) | ORAL | 0 refills | Status: DC

## 2020-05-12 NOTE — Addendum Note (Signed)
Addended by: Ronnald Nian on: 05/12/2020 11:56 AM   Modules accepted: Orders

## 2020-05-21 ENCOUNTER — Encounter: Payer: Self-pay | Admitting: Family Medicine

## 2020-05-21 ENCOUNTER — Ambulatory Visit (INDEPENDENT_AMBULATORY_CARE_PROVIDER_SITE_OTHER): Payer: Medicare HMO | Admitting: Family Medicine

## 2020-05-21 ENCOUNTER — Other Ambulatory Visit: Payer: Self-pay

## 2020-05-21 VITALS — BP 108/64 | HR 76 | Temp 97.8°F | Ht 69.0 in | Wt 181.6 lb

## 2020-05-21 DIAGNOSIS — F988 Other specified behavioral and emotional disorders with onset usually occurring in childhood and adolescence: Secondary | ICD-10-CM

## 2020-05-21 DIAGNOSIS — Z8042 Family history of malignant neoplasm of prostate: Secondary | ICD-10-CM | POA: Diagnosis not present

## 2020-05-21 DIAGNOSIS — R69 Illness, unspecified: Secondary | ICD-10-CM | POA: Diagnosis not present

## 2020-05-21 DIAGNOSIS — F341 Dysthymic disorder: Secondary | ICD-10-CM

## 2020-05-21 DIAGNOSIS — Z0000 Encounter for general adult medical examination without abnormal findings: Secondary | ICD-10-CM

## 2020-05-21 DIAGNOSIS — Z87891 Personal history of nicotine dependence: Secondary | ICD-10-CM

## 2020-05-21 DIAGNOSIS — E785 Hyperlipidemia, unspecified: Secondary | ICD-10-CM | POA: Diagnosis not present

## 2020-05-21 DIAGNOSIS — Z131 Encounter for screening for diabetes mellitus: Secondary | ICD-10-CM

## 2020-05-21 DIAGNOSIS — G43001 Migraine without aura, not intractable, with status migrainosus: Secondary | ICD-10-CM | POA: Diagnosis not present

## 2020-05-21 DIAGNOSIS — Z125 Encounter for screening for malignant neoplasm of prostate: Secondary | ICD-10-CM | POA: Diagnosis not present

## 2020-05-21 DIAGNOSIS — E1169 Type 2 diabetes mellitus with other specified complication: Secondary | ICD-10-CM | POA: Diagnosis not present

## 2020-05-21 DIAGNOSIS — F332 Major depressive disorder, recurrent severe without psychotic features: Secondary | ICD-10-CM

## 2020-05-21 LAB — POCT GLYCOSYLATED HEMOGLOBIN (HGB A1C): Hemoglobin A1C: 7.7 % — AB (ref 4.0–5.6)

## 2020-05-21 MED ORDER — PAROXETINE HCL 40 MG PO TABS: ORAL_TABLET | ORAL | 3 refills | Status: DC

## 2020-05-21 MED ORDER — AMPHETAMINE-DEXTROAMPHETAMINE 30 MG PO TABS: 30.0000 mg | ORAL_TABLET | Freq: Three times a day (TID) | ORAL | 0 refills | Status: DC

## 2020-05-21 MED ORDER — ALPRAZOLAM 1 MG PO TABS: 1.0000 mg | ORAL_TABLET | Freq: Two times a day (BID) | ORAL | 1 refills | Status: DC | PRN

## 2020-05-21 MED ORDER — ATORVASTATIN CALCIUM 20 MG PO TABS: 20.0000 mg | ORAL_TABLET | Freq: Every day | ORAL | 3 refills | Status: DC

## 2020-05-21 MED ORDER — TRAZODONE HCL 150 MG PO TABS: 150.0000 mg | ORAL_TABLET | Freq: Every evening | ORAL | 3 refills | Status: DC | PRN

## 2020-05-21 NOTE — Progress Notes (Signed)
Dakota Gray is a 69 y.o. male who presents for annual wellness visit CPE and follow-up on chronic medical conditions.  He does have underlying ADD and has been fairly stable on Adderall 3 times per day.  He describes the first pill not working well however the second pill being of benefit.  He is going to try and cut back on his dosing to twice per day if possible.  I explained that he did not need to take the pill less he needs to stay focused.  It was difficult to say how much focus he is getting, but he states that he can tell a difference.  Review of his record also indicates he was diagnosed as glucose intolerant with his last visit.  He has not made much in the way of changes to his lifestyle.  He continues on does row which is helping with his sleep.  Continues on Paxil which helps keep him relatively calm.  He is still going through some financial and legal issues with his former residence.  He states that he might need more Xanax to help with that.  The propranolol is working well to keep his migraine under good control.  There is a family history of prostate cancer.  He did have one episode of fall but describes a situation where it was more postural than anything else.  Family and social history was reviewed.   Immunizations and Health Maintenance Immunization History  Administered Date(s) Administered  . Fluad Quad(high Dose 65+) 10/02/2019  . Influenza Split 10/12/2000, 10/30/2001, 11/07/2002, 11/17/2014  . Influenza, High Dose Seasonal PF 10/26/2018  . Influenza,inj,Quad PF,6+ Mos 10/11/2013, 10/16/2015  . PFIZER SARS-COV-2 Vaccination 02/25/2020, 03/25/2020  . Pneumococcal Conjugate-13 03/28/2018  . Pneumococcal Polysaccharide-23 07/19/2019  . Tdap 05/08/2013  . Zoster 07/28/2007  . Zoster Recombinat (Shingrix) 05/24/2017, 09/14/2017   There are no preventive care reminders to display for this patient.  Last colonoscopy: cologuard neg. 05-17-18 Last PSA: 07/19/19 Dentist: once a  year  Ophtho: once a year Exercise: not much  Other doctors caring for patient include: Dr. [facility],   Advanced Directives: Not on file information asked for.    Depression screen:  See questionnaire below.     Depression screen PHQ 2/9 03/28/2018  Decreased Interest 0  Down, Depressed, Hopeless 0  PHQ - 2 Score 0    Fall Screen: See Questionaire below.   Fall Risk  03/28/2018  Falls in the past year? Yes  Number falls in past yr: 1  Injury with Fall? No    ADL screen:  See questionnaire below.  Functional Status Survey:     Review of Systems  Constitutional: -, -unexpected weight change, -anorexia, -fatigue Allergy: -sneezing, -itching, -congestion Dermatology: denies changing moles, rash, lumps ENT: -runny nose, -ear pain, -sore throat,  Cardiology:  -chest pain, -palpitations, -orthopnea, Respiratory: -cough, -shortness of breath, -dyspnea on exertion, -wheezing,  Gastroenterology: -abdominal pain, -nausea, -vomiting, -diarrhea, -constipation, -dysphagia Hematology: -bleeding or bruising problems Musculoskeletal: -arthralgias, -myalgias, -joint swelling, -back pain, - Ophthalmology: -vision changes,  Urology: -dysuria, -difficulty urinating,  -urinary frequency, -urgency, incontinence Neurology: -, -numbness, , -memory loss, -falls, -dizziness    PHYSICAL EXAM:   General Appearance: Alert, cooperative, no distress, appears stated age Head: Normocephalic, without obvious abnormality, atraumatic Eyes: PERRL, conjunctiva/corneas clear, EOM's intact,  Ears: Normal TM's and external ear canals Nose: Nares normal, mucosa normal, no drainage or sinus   tenderness Throat: Lips, mucosa, and tongue normal; teeth and gums normal Neck: Supple, no  lymphadenopathy, thyroid:no enlargement/tenderness/nodules; no carotid bruit or JVD Lungs: Clear to auscultation bilaterally without wheezes, rales or ronchi; respirations unlabored Heart: Regular rate and rhythm, S1 and S2  normal, no murmur, rub or gallop Abdomen: Soft, non-tender, nondistended, normoactive bowel sounds, no masses, no hepatosplenomegaly Extremities: No clubbing, cyanosis or edema Pulses: 2+ and symmetric all extremities Skin: Skin color, texture, turgor normal, no rashes or lesions Lymph nodes: Cervical, supraclavicular, and axillary nodes normal Neurologic: CNII-XII intact, normal strength, sensation and gait; reflexes 2+ and symmetric throughout   Psych: Normal mood, affect, hygiene and grooming Hemoglobin A1c 7.7 ASSESSMENT/PLAN: Routine general medical examination at a health care facility   Attention deficit disorder, unspecified hyperactivity presence - Plan: amphetamine-dextroamphetamine (ADDERALL) 30 MG tablet, amphetamine-dextroamphetamine (ADDERALL) 30 MG tablet, amphetamine-dextroamphetamine (ADDERALL) 30 MG tablet  Migraine without aura and with status migrainosus, not intractable:   Continue on propranolol  Dysthymia - Plan: ALPRAZolam (XANAX) 1 MG tablet, PARoxetine (PAXIL) 40 MG tablet, traZODone (DESYREL) 150 MG tablet  Former smoker:   Previous AAA evaluation was negative  Screening for prostate cancer - Plan: PSA  Hyperlipidemia associated with type 2 diabetes mellitus (HCC) - Plan: Lipid panel, atorvastatin (LIPITOR) 20 MG tablet : I discussed the fact that now we need to be more aggressive in treating his lipids.  Discussed possible side effects of medication.  Type 2 diabetes mellitus with other specified complication, without long-term current use of insulin (HCC) - Plan: CBC with Differential/Platelet, Comprehensive metabolic panel, Lipid panel, POCT glycosylated hemoglobin (Hb A1C)  Major depressive disorder, recurrent severe without psychotic features (HCC) - Plan: PARoxetine (PAXIL) 40 MG tablet, traZODone (DESYREL) 150 MG tablet  Family history of prostate cancer in father:  PSA ordered.  Discussed PSA screening (risks/benefits), recommended at least 30 minutes  of aerobic activity at least 5 days/week; proper  healthy diet reviewed; Immunization recommendations discussed.  Colonoscopy recommendations reviewed. He will return here for further counseling concerning his diabetes.  Recommend that he go to the American diabetes Association website to get more information concerning diabetes.  He will be brought back to be taught how to check his CBGs.  Medicare Attestation I have personally reviewed: The patient's medical and social history Their use of alcohol, tobacco or illicit drugs Their current medications and supplements The patient's functional ability including ADLs,fall risks, home safety risks, cognitive, and hearing and visual impairment Diet and physical activities Evidence for depression or mood disorders  The patient's weight, height, and BMI have been recorded in the chart.  I have made referrals, counseling, and provided education to the patient based on review of the above and I have provided the patient with a written personalized care plan for preventive services.     Sharlot Gowda, MD   05/21/2020

## 2020-05-21 NOTE — Patient Instructions (Signed)
  Mr. Dakota Gray , Thank you for taking time to come for your Medicare Wellness Visit. I appreciate your ongoing commitment to your health goals. Please review the following plan we discussed and let me know if I can assist you in the future.   These are the goals we discussed: He will continue on present medications also recommend coming back to get educated further concerning diabetes and checking blood sugars. This is a list of the screening recommended for you and due dates:  Health Maintenance  Topic Date Due  . Complete foot exam   Never done  . Eye exam for diabetics  Never done  . Urine Protein Check  Never done  . Flu Shot  07/27/2020  . Hemoglobin A1C  11/21/2020  . Cologuard (Stool DNA test)  05/17/2021  . Tetanus Vaccine  05/09/2023  . COVID-19 Vaccine  Completed  .  Hepatitis C: One time screening is recommended by Center for Disease Control  (CDC) for  adults born from [birth year] through 1965.   Completed  . Pneumonia vaccines  Completed

## 2020-05-22 ENCOUNTER — Encounter: Payer: Self-pay | Admitting: Family Medicine

## 2020-05-22 LAB — CBC WITH DIFFERENTIAL/PLATELET
Basophils Absolute: 0 10*3/uL (ref 0.0–0.2)
Basos: 1 %
EOS (ABSOLUTE): 0.2 10*3/uL (ref 0.0–0.4)
Eos: 3 %
Hematocrit: 43.5 % (ref 37.5–51.0)
Hemoglobin: 14.9 g/dL (ref 13.0–17.7)
Immature Grans (Abs): 0.1 10*3/uL (ref 0.0–0.1)
Immature Granulocytes: 1 %
Lymphocytes Absolute: 3 10*3/uL (ref 0.7–3.1)
Lymphs: 47 %
MCH: 30 pg (ref 26.6–33.0)
MCHC: 34.3 g/dL (ref 31.5–35.7)
MCV: 88 fL (ref 79–97)
Monocytes Absolute: 0.6 10*3/uL (ref 0.1–0.9)
Monocytes: 9 %
Neutrophils Absolute: 2.5 10*3/uL (ref 1.4–7.0)
Neutrophils: 39 %
Platelets: 239 10*3/uL (ref 150–450)
RBC: 4.97 x10E6/uL (ref 4.14–5.80)
RDW: 12.4 % (ref 11.6–15.4)
WBC: 6.4 10*3/uL (ref 3.4–10.8)

## 2020-05-22 LAB — COMPREHENSIVE METABOLIC PANEL
ALT: 18 IU/L (ref 0–44)
AST: 19 IU/L (ref 0–40)
Albumin/Globulin Ratio: 2 (ref 1.2–2.2)
Albumin: 4.2 g/dL (ref 3.8–4.8)
Alkaline Phosphatase: 92 IU/L (ref 48–121)
BUN/Creatinine Ratio: 12 (ref 10–24)
BUN: 11 mg/dL (ref 8–27)
Bilirubin Total: 0.4 mg/dL (ref 0.0–1.2)
CO2: 23 mmol/L (ref 20–29)
Calcium: 9.3 mg/dL (ref 8.6–10.2)
Chloride: 101 mmol/L (ref 96–106)
Creatinine, Ser: 0.89 mg/dL (ref 0.76–1.27)
GFR calc Af Amer: 102 mL/min/{1.73_m2} (ref >59)
GFR calc non Af Amer: 88 mL/min/{1.73_m2} (ref >59)
Globulin, Total: 2.1 g/dL (ref 1.5–4.5)
Glucose: 160 mg/dL — ABNORMAL HIGH (ref 65–99)
Potassium: 4 mmol/L (ref 3.5–5.2)
Sodium: 137 mmol/L (ref 134–144)
Total Protein: 6.3 g/dL (ref 6.0–8.5)

## 2020-05-22 LAB — LIPID PANEL
Chol/HDL Ratio: 6.1 ratio — ABNORMAL HIGH (ref 0.0–5.0)
Cholesterol, Total: 209 mg/dL — ABNORMAL HIGH (ref 100–199)
HDL: 34 mg/dL — ABNORMAL LOW (ref >39)
LDL Chol Calc (NIH): 114 mg/dL — ABNORMAL HIGH (ref 0–99)
Triglycerides: 351 mg/dL — ABNORMAL HIGH (ref 0–149)
VLDL Cholesterol Cal: 61 mg/dL — ABNORMAL HIGH (ref 5–40)

## 2020-05-22 LAB — PSA: Prostate Specific Ag, Serum: 2.9 ng/mL (ref 0.0–4.0)

## 2020-06-25 ENCOUNTER — Other Ambulatory Visit: Payer: Self-pay | Admitting: Family Medicine

## 2020-06-25 DIAGNOSIS — F332 Major depressive disorder, recurrent severe without psychotic features: Secondary | ICD-10-CM

## 2020-06-25 DIAGNOSIS — F341 Dysthymic disorder: Secondary | ICD-10-CM

## 2020-06-25 NOTE — Telephone Encounter (Signed)
[facility]. Please advise. KH 

## 2020-07-09 ENCOUNTER — Encounter: Payer: Self-pay | Admitting: Family Medicine

## 2020-07-10 ENCOUNTER — Telehealth: Payer: Self-pay

## 2020-07-10 NOTE — Telephone Encounter (Signed)
I submitted a PA for the pts. Xanax tablet and it was approved from 12/28/19-12/26/20.

## 2020-07-11 ENCOUNTER — Other Ambulatory Visit: Payer: Medicare HMO

## 2020-07-11 ENCOUNTER — Other Ambulatory Visit: Payer: Self-pay

## 2020-07-11 DIAGNOSIS — R69 Illness, unspecified: Secondary | ICD-10-CM | POA: Diagnosis not present

## 2020-07-11 MED ORDER — ONETOUCH DELICA LANCETS 33G MISC: 1.0000 | Freq: Every day | 5 refills | Status: DC

## 2020-07-11 MED ORDER — GLUCOSE BLOOD VI STRP: ORAL_STRIP | 12 refills | Status: AC

## 2020-07-12 ENCOUNTER — Encounter: Payer: Self-pay | Admitting: Family Medicine

## 2020-07-12 DIAGNOSIS — F341 Dysthymic disorder: Secondary | ICD-10-CM

## 2020-07-14 MED ORDER — XANAX 1 MG PO TABS
1.0000 mg | ORAL_TABLET | Freq: Every evening | ORAL | 1 refills | Status: DC | PRN
Start: 2020-07-14 — End: 2020-10-21

## 2020-07-17 DIAGNOSIS — R69 Illness, unspecified: Secondary | ICD-10-CM | POA: Diagnosis not present

## 2020-07-22 DIAGNOSIS — R69 Illness, unspecified: Secondary | ICD-10-CM | POA: Diagnosis not present

## 2020-08-12 DIAGNOSIS — R69 Illness, unspecified: Secondary | ICD-10-CM | POA: Diagnosis not present

## 2020-08-21 ENCOUNTER — Ambulatory Visit: Payer: Self-pay | Admitting: Family Medicine

## 2020-09-03 ENCOUNTER — Ambulatory Visit (INDEPENDENT_AMBULATORY_CARE_PROVIDER_SITE_OTHER): Payer: Medicare HMO | Admitting: Family Medicine

## 2020-09-03 ENCOUNTER — Other Ambulatory Visit: Payer: Self-pay

## 2020-09-03 VITALS — BP 122/70 | HR 84 | Temp 98.8°F | Wt 182.2 lb

## 2020-09-03 DIAGNOSIS — Z23 Encounter for immunization: Secondary | ICD-10-CM | POA: Diagnosis not present

## 2020-09-03 DIAGNOSIS — E785 Hyperlipidemia, unspecified: Secondary | ICD-10-CM

## 2020-09-03 DIAGNOSIS — E1169 Type 2 diabetes mellitus with other specified complication: Secondary | ICD-10-CM | POA: Diagnosis not present

## 2020-09-03 LAB — POCT GLYCOSYLATED HEMOGLOBIN (HGB A1C): Hemoglobin A1C: 8.1 % — AB (ref 4.0–5.6)

## 2020-09-03 MED ORDER — LISINOPRIL 5 MG PO TABS: 5.0000 mg | ORAL_TABLET | Freq: Every day | ORAL | 3 refills | Status: DC

## 2020-09-03 MED ORDER — METFORMIN HCL 500 MG PO TABS: 500.0000 mg | ORAL_TABLET | Freq: Two times a day (BID) | ORAL | 5 refills | Status: DC

## 2020-09-03 NOTE — Patient Instructions (Signed)
Check your blood sugars daily before meals or 2 hours after meals.  2 hours after meals should be below 180 20 minutes of something physical daily Cut back on carbohydrates

## 2020-09-03 NOTE — Progress Notes (Signed)
  Subjective:    Patient ID: Dakota Gray, male    DOB: April 21, 1951, 69 y.o.   MRN: [id number]  Dakota Gray is a 69 y.o. male who presents for follow-up of Type 2 diabetes mellitus.  Home blood sugar records: meter records and keeps a log, fasting, 130-367 Current symptoms/problems include none. Daily foot checks: yes   Any foot concerns: none Exercise: staying active Diet:poor He explains that he did not believe that he had diabetes since he was not having any symptoms.  I explained that we were trying to prevent him from having any symptoms.  He has been checking his blood sugars mainly in the morning and they sometimes run in the mid 200 range.  He has been taking Lipitor and having no difficulty with that. The following portions of the patient's history were reviewed and updated as appropriate: allergies, current medications, past medical history, past social history and problem list.  ROS as in subjective above.     Objective:    Physical Exam Alert and in no distress otherwise not examined. Hemoglobin A1c is 8.1   Lab Review Diabetic Labs Latest Ref Rng & Units 05/21/2020 07/19/2019 03/28/2018 11/21/2017 02/14/2017  HbA1c 4.0 - 5.6 % 7.7(A) 6.6(H) - - 5.4  Chol 100 - 199 mg/dL 144(Y) 185(U) 314(H) - 210(H)  HDL >39 mg/dL 70(Y) 43 63(Z) - 41  Calc LDL 0 - 99 mg/dL 858(I) 502(D) 91 - 741(O)  Triglycerides 0 - 149 mg/dL 878(M) 767(M) 094(B) - 211(H)  Creatinine 0.76 - 1.27 mg/dL 0.96 2.83 6.62 9.47(M) 1.08   BP/Weight 05/21/2020 07/19/2019 12/07/2018 11/01/2018 03/28/2018  Systolic BP 108 118 130 120 124  Diastolic BP 64 76 90 84 86  Wt. (Lbs) 181.6 185 183.8 177.4 170  BMI 26.82 27.32 27.14 26.2 25.1  Some encounter information is confidential and restricted. Go to Review Flowsheets activity to see all data.   No flowsheet data found.  Lekeith  reports that he quit smoking about 35 years ago. His smoking use included cigarettes. He has never used smokeless tobacco. He reports that  he does not drink alcohol and does not use drugs.     Assessment & Plan:    Type 2 diabetes mellitus with other specified complication, without long-term current use of insulin (HCC) - Plan: POCT glycosylated hemoglobin (Hb A1C), metFORMIN (GLUCOPHAGE) 500 MG tablet, lisinopril (ZESTRIL) 5 MG tablet  Need for influenza vaccination - Plan: Flu Vaccine QUAD High Dose(Fluad)  Hyperlipidemia associated with type 2 diabetes mellitus (HCC) - Plan: Lipid panel   1. Rx changes: Metformin and lisinopril was added to his regimen. 2. Education: Reviewed 'ABCs' of diabetes management (respective goals in parentheses):  A1C (<7), blood pressure (<130/80), and cholesterol (LDL <100). 3. Compliance at present is estimated to be fair. Efforts to improve compliance (if necessary) will be directed at increased exercise. 4. Follow up: 4 months I discussed adding Metformin and lisinopril.  Discussed possible GI side effects from Metformin as well as cough and swelling with the lisinopril he will keep me informed.  Also strongly encouraged him to check his blood sugars 2 hours after meals and eat more eat more frequent but smaller meals.  Recommend 20 minutes of something physical on a daily basis.

## 2020-09-04 LAB — LIPID PANEL
Chol/HDL Ratio: 3.3 ratio (ref 0.0–5.0)
Cholesterol, Total: 129 mg/dL (ref 100–199)
HDL: 39 mg/dL — ABNORMAL LOW (ref >39)
LDL Chol Calc (NIH): 59 mg/dL (ref 0–99)
Triglycerides: 189 mg/dL — ABNORMAL HIGH (ref 0–149)
VLDL Cholesterol Cal: 31 mg/dL (ref 5–40)

## 2020-09-08 ENCOUNTER — Encounter: Payer: Self-pay | Admitting: Family Medicine

## 2020-09-10 DIAGNOSIS — R69 Illness, unspecified: Secondary | ICD-10-CM | POA: Diagnosis not present

## 2020-09-19 DIAGNOSIS — R69 Illness, unspecified: Secondary | ICD-10-CM | POA: Diagnosis not present

## 2020-10-01 ENCOUNTER — Other Ambulatory Visit: Payer: Self-pay | Admitting: Family Medicine

## 2020-10-01 ENCOUNTER — Encounter: Payer: Self-pay | Admitting: Family Medicine

## 2020-10-01 DIAGNOSIS — F988 Other specified behavioral and emotional disorders with onset usually occurring in childhood and adolescence: Secondary | ICD-10-CM

## 2020-10-01 DIAGNOSIS — E1169 Type 2 diabetes mellitus with other specified complication: Secondary | ICD-10-CM

## 2020-10-01 MED ORDER — METFORMIN HCL 500 MG PO TABS: 500.0000 mg | ORAL_TABLET | Freq: Two times a day (BID) | ORAL | 1 refills | Status: DC

## 2020-10-01 MED ORDER — AMPHETAMINE-DEXTROAMPHETAMINE 30 MG PO TABS: 30.0000 mg | ORAL_TABLET | Freq: Three times a day (TID) | ORAL | 0 refills | Status: DC

## 2020-10-02 DIAGNOSIS — R69 Illness, unspecified: Secondary | ICD-10-CM | POA: Diagnosis not present

## 2020-10-12 ENCOUNTER — Encounter: Payer: Self-pay | Admitting: Family Medicine

## 2020-10-13 MED ORDER — PIOGLITAZONE HCL-METFORMIN HCL 15-850 MG PO TABS: 1.0000 | ORAL_TABLET | Freq: Two times a day (BID) | ORAL | 0 refills | Status: DC

## 2020-10-20 ENCOUNTER — Encounter: Payer: Self-pay | Admitting: Family Medicine

## 2020-10-21 MED ORDER — XANAX 1 MG PO TABS: 1.0000 mg | ORAL_TABLET | Freq: Every evening | ORAL | 1 refills | Status: DC | PRN

## 2020-10-23 MED ORDER — PIOGLITAZONE HCL-METFORMIN HCL 15-850 MG PO TABS
1.0000 | ORAL_TABLET | Freq: Two times a day (BID) | ORAL | 1 refills | Status: DC
Start: 2020-10-23 — End: 2021-04-21

## 2020-11-11 ENCOUNTER — Ambulatory Visit: Payer: Medicare HMO

## 2020-11-13 ENCOUNTER — Ambulatory Visit (INDEPENDENT_AMBULATORY_CARE_PROVIDER_SITE_OTHER): Payer: Medicare HMO

## 2020-11-13 ENCOUNTER — Other Ambulatory Visit: Payer: Self-pay

## 2020-11-13 DIAGNOSIS — Z23 Encounter for immunization: Secondary | ICD-10-CM | POA: Diagnosis not present

## 2020-12-04 ENCOUNTER — Encounter: Payer: Self-pay | Admitting: Family Medicine

## 2020-12-05 ENCOUNTER — Encounter: Payer: Self-pay | Admitting: Medical

## 2020-12-05 ENCOUNTER — Ambulatory Visit (INDEPENDENT_AMBULATORY_CARE_PROVIDER_SITE_OTHER): Payer: Medicare HMO | Admitting: Medical

## 2020-12-05 ENCOUNTER — Other Ambulatory Visit: Payer: Self-pay

## 2020-12-05 VITALS — BP 110/76 | HR 89 | Ht 69.0 in | Wt 177.6 lb

## 2020-12-05 DIAGNOSIS — E1165 Type 2 diabetes mellitus with hyperglycemia: Secondary | ICD-10-CM | POA: Diagnosis not present

## 2020-12-05 DIAGNOSIS — S0181XA Laceration without foreign body of other part of head, initial encounter: Secondary | ICD-10-CM

## 2020-12-05 DIAGNOSIS — R55 Syncope and collapse: Secondary | ICD-10-CM | POA: Diagnosis not present

## 2020-12-05 DIAGNOSIS — W19XXXA Unspecified fall, initial encounter: Secondary | ICD-10-CM | POA: Diagnosis not present

## 2020-12-05 DIAGNOSIS — G43809 Other migraine, not intractable, without status migrainosus: Secondary | ICD-10-CM | POA: Diagnosis not present

## 2020-12-05 DIAGNOSIS — G43909 Migraine, unspecified, not intractable, without status migrainosus: Secondary | ICD-10-CM | POA: Insufficient documentation

## 2020-12-05 MED ORDER — CEPHALEXIN 500 MG PO CAPS: 500.0000 mg | ORAL_CAPSULE | Freq: Three times a day (TID) | ORAL | 0 refills | Status: DC

## 2020-12-05 MED ORDER — DIPHENOXYLATE-ATROPINE 2.5-0.025 MG PO TABS: 1.0000 | ORAL_TABLET | Freq: Two times a day (BID) | ORAL | 0 refills | Status: DC | PRN

## 2020-12-05 NOTE — Patient Instructions (Signed)
Your recent fall likely was related to vasovagal drop in blood pressure  Recommendations:  Change your Actoplusmet to 1/2 tablet daily for a week, then if tolerating this, change to 1/2 tablet twice daily  Hydrate with 80-100 ounces of water daily  Avoid sudden movements from lying to sitting or standing position  Be more cautious particularly if you get up at night to use the restroom  Continue the propranolol like usual for now since this is a long term medication  Consider taking a probiotic daily  Avoid or limit greasy, spicy foods and foods that gas you up such as onions and pinto beans  Keep your chin clean with soap and water  Consider using neosporin topically or triple antibiotic ointment for the next 4-5 days  If any signs of pus, redness, warmth or swelling, then begin oral antibiotic sent to pharmacy  If still not tolerating actoplusmet within 3-4 weeks, then follow up with Dr. Redmond School  Check your fating sugars daily and check glucose anytime you feel like headed, dizzy, or shaky      Fall Prevention in the Home, Adult Falls can cause injuries. They can happen to people of all ages. There are many things you can do to make your home safe and to help prevent falls. Ask for help when making these changes, if needed. What actions can I take to prevent falls? General Instructions  Use good lighting in all rooms. Replace any light bulbs that burn out.  Turn on the lights when you go into a dark area. Use night-lights.  Keep items that you use often in easy-to-reach places. Lower the shelves around your home if necessary.  Set up your furniture so you have a clear path. Avoid moving your furniture around.  Do not have throw rugs and other things on the floor that can make you trip.  Avoid walking on wet floors.  If any of your floors are uneven, fix them.  Add color or contrast paint or tape to clearly mark and help you see: ? Any grab bars or  handrails. ? First and last steps of stairways. ? Where the edge of each step is.  If you use a stepladder: ? Make sure that it is fully opened. Do not climb a closed stepladder. ? Make sure that both sides of the stepladder are locked into place. ? Ask someone to hold the stepladder for you while you use it.  If there are any pets around you, be aware of where they are. What can I do in the bathroom?      Keep the floor dry. Clean up any water that spills onto the floor as soon as it happens.  Remove soap buildup in the tub or shower regularly.  Use non-skid mats or decals on the floor of the tub or shower.  Attach bath mats securely with double-sided, non-slip rug tape.  If you need to sit down in the shower, use a plastic, non-slip stool.  Install grab bars by the toilet and in the tub and shower. Do not use towel bars as grab bars. What can I do in the bedroom?  Make sure that you have a light by your bed that is easy to reach.  Do not use any sheets or blankets that are too big for your bed. They should not hang down onto the floor.  Have a firm chair that has side arms. You can use this for support while you get dressed. What can I  do in the kitchen?  Clean up any spills right away.  If you need to reach something above you, use a strong step stool that has a grab bar.  Keep electrical cords out of the way.  Do not use floor polish or wax that makes floors slippery. If you must use wax, use non-skid floor wax. What can I do with my stairs?  Do not leave any items on the stairs.  Make sure that you have a light switch at the top of the stairs and the bottom of the stairs. If you do not have them, ask someone to add them for you.  Make sure that there are handrails on both sides of the stairs, and use them. Fix handrails that are broken or loose. Make sure that handrails are as long as the stairways.  Install non-slip stair treads on all stairs in your  home.  Avoid having throw rugs at the top or bottom of the stairs. If you do have throw rugs, attach them to the floor with carpet tape.  Choose a carpet that does not hide the edge of the steps on the stairway.  Check any carpeting to make sure that it is firmly attached to the stairs. Fix any carpet that is loose or worn. What can I do on the outside of my home?  Use bright outdoor lighting.  Regularly fix the edges of walkways and driveways and fix any cracks.  Remove anything that might make you trip as you walk through a door, such as a raised step or threshold.  Trim any bushes or trees on the path to your home.  Regularly check to see if handrails are loose or broken. Make sure that both sides of any steps have handrails.  Install guardrails along the edges of any raised decks and porches.  Clear walking paths of anything that might make someone trip, such as tools or rocks.  Have any leaves, snow, or ice cleared regularly.  Use sand or salt on walking paths during winter.  Clean up any spills in your garage right away. This includes grease or oil spills. What other actions can I take?  Wear shoes that: ? Have a low heel. Do not wear high heels. ? Have rubber bottoms. ? Are comfortable and fit you well. ? Are closed at the toe. Do not wear open-toe sandals.  Use tools that help you move around (mobility aids) if they are needed. These include: ? Canes. ? Walkers. ? Scooters. ? Crutches.  Review your medicines with your doctor. Some medicines can make you feel dizzy. This can increase your chance of falling. Ask your doctor what other things you can do to help prevent falls. Where to find more information  Centers for Disease Control and Prevention, STEADI: https://garcia.biz/  [employer] on Aging: BrainJudge.co.uk Contact a doctor if:  You are afraid of falling at home.  You feel weak, drowsy, or dizzy at home.  You fall at  home. Summary  There are many simple things that you can do to make your home safe and to help prevent falls.  Ways to make your home safe include removing tripping hazards and installing grab bars in the bathroom.  Ask for help when making these changes in your home. This information is not intended to replace advice given to you by your health care provider. Make sure you discuss any questions you have with your health care provider. Document Revised: 04/05/2019 Document Reviewed: 07/28/2017 Elsevier Patient Education  [place].      Hypoglycemia Hypoglycemia is when the sugar (glucose) level in your blood is too low. Signs of low blood sugar may include:  Feeling: ? Hungry. ? Worried or nervous (anxious). ? Sweaty and clammy. ? Confused. ? Dizzy. ? Sleepy. ? Sick to your stomach (nauseous).  Having: ? A fast heartbeat. ? A headache. ? A change in your vision. ? Tingling or no feeling (numbness) around your mouth, lips, or tongue. ? Jerky movements that you cannot control (seizure).  Having trouble with: ? Moving (coordination). ? Sleeping. ? Passing out (fainting). ? Getting upset easily (irritability). Low blood sugar can happen to people who have diabetes and people who do not have diabetes. Low blood sugar can happen quickly, and it can be an emergency. Treating low blood sugar Low blood sugar is often treated by eating or drinking something sugary right away, such as:  Fruit juice, 4-6 oz (120-150 mL).  Regular soda (not diet soda), 4-6 oz (120-150 mL).  Low-fat milk, 4 oz (120 mL).  Several pieces of hard candy.  Sugar or honey, 1 Tbsp (15 mL). Treating low blood sugar if you have diabetes If you can think clearly and swallow safely, follow the 15:15 rule:  Take 15 grams of a fast-acting carb (carbohydrate). Talk with your doctor about how much you should take.  Always keep a source of fast-acting carb with you, such as: ? Sugar tablets  (glucose pills). Take 3-4 pills. ? 6-8 pieces of hard candy. ? 4-6 oz (120-150 mL) of fruit juice. ? 4-6 oz (120-150 mL) of regular (not diet) soda. ? 1 Tbsp (15 mL) honey or sugar.  Check your blood sugar 15 minutes after you take the carb.  If your blood sugar is still at or below 70 mg/dL (3.9 mmol/L), take 15 grams of a carb again.  If your blood sugar does not go above 70 mg/dL (3.9 mmol/L) after 3 tries, get help right away.  After your blood sugar goes back to normal, eat a meal or a snack within 1 hour.  Treating very low blood sugar If your blood sugar is at or below 54 mg/dL (3 mmol/L), you have very low blood sugar (severe hypoglycemia). This may also cause:  Passing out.  Jerky movements you cannot control (seizure).  Losing consciousness (coma). This is an emergency. Do not wait to see if the symptoms will go away. Get medical help right away. Call your local emergency services (911 in the [place] Do not drive yourself to the hospital. If you have very low blood sugar and you cannot eat or drink, you may need a glucagon shot (injection). A family member or friend should learn how to check your blood sugar and how to give you a glucagon shot. Ask your doctor if you need to have a glucagon shot kit at home. Follow these instructions at home: General instructions  Take over-the-counter and prescription medicines only as told by your doctor.  Stay aware of your blood sugar as told by your doctor.  Limit alcohol intake to no more than 1 drink a day for nonpregnant women and 2 drinks a day for men. One drink equals 12 oz of beer (355 mL), 5 oz of wine (148 mL), or 1 oz of hard liquor (44 mL).  Keep all follow-up visits as told by your doctor. This is important. If you have diabetes:   Follow your diabetes care plan as told by your doctor. Make sure you: ?  Know the signs of low blood sugar. ? Take your medicines as told. ? Follow your exercise and meal plan. ? Eat on  time. Do not skip meals. ? Check your blood sugar as often as told by your doctor. Always check it before and after exercise. ? Follow your sick day plan when you cannot eat or drink normally. Make this plan ahead of time with your doctor.  Share your diabetes care plan with: ? Your work or school. ? People you live with.  Check your pee (urine) for ketones: ? When you are sick. ? As told by your doctor.  Carry a card or wear jewelry that says you have diabetes. Contact a doctor if:  You have trouble keeping your blood sugar in your target range.  You have low blood sugar often. Get help right away if:  You still have symptoms after you eat or drink something sugary.  Your blood sugar is at or below 54 mg/dL (3 mmol/L).  You have jerky movements that you cannot control.  You pass out. These symptoms may be an emergency. Do not wait to see if the symptoms will go away. Get medical help right away. Call your local emergency services (911 in the [place] Do not drive yourself to the hospital. Summary  Hypoglycemia happens when the level of sugar (glucose) in your blood is too low.  Low blood sugar can happen to people who have diabetes and people who do not have diabetes. Low blood sugar can happen quickly, and it can be an emergency.  Make sure you know the signs of low blood sugar and know how to treat it.  Always keep a source of sugar (fast-acting carb) with you to treat low blood sugar. This information is not intended to replace advice given to you by your health care provider. Make sure you discuss any questions you have with your health care provider. Document Revised: 04/05/2019 Document Reviewed: 01/16/2016 Elsevier Patient Education  2020 [employer].

## 2020-12-05 NOTE — Progress Notes (Signed)
Subjective:  Dakota Gray is a 69 y.o. male who presents for Chief Complaint  Patient presents with  . Fall    Bust chin      Here for injury.   DOI 12/03/2020.    He has had some recent problems with diarrhea, not just a little loose stool.  He attributes this to metformin that was started late October 2021.  Was on plain metformin initially but due to nausea, changed to combo drug with metformin.    On the day of injury, got up 4am to go to the bathroom due to diarrhea.   Got a head rush, felt flushed, was headed to the bathroom and just collapsed.  Went head first into side of bathtub.  Hit chin on bathtub causing laceration of underneath chin.   Was bleeding everywhere.   Came in due to the diarrhea and the fall.   He notes if he didn't have the diarrhea he wouldn't have had the fall.   Not sure about balance issues.    No chest pain, no palpations, no edema, no SOB, no confusion, no recent headaches since the fall.     The wound on the chin is doing fine.    In general uses Propranolol QHS for migraine prevention  No other aggravating or relieving factors.    No other c/o.  Past Medical History:  Diagnosis Date  . Acne   . ADD (attention deficit disorder)   . Alcoholic ([place])   . Allergic rhinitis   . Diabetes mellitus without complication ([place])   . Gastric ulcer   . GERD (gastroesophageal reflux disease)   . Inguinal hernia    right  . Migraine headache   . Pancreatitis   . Rosacea    Current Outpatient Medications on File Prior to Visit  Medication Sig Dispense Refill  . amphetamine-dextroamphetamine (ADDERALL) 30 MG tablet Take 1 tablet by mouth 3 (three) times daily. 90 tablet 0  . amphetamine-dextroamphetamine (ADDERALL) 30 MG tablet Take 1 tablet by mouth 3 (three) times daily. 90 tablet 0  . amphetamine-dextroamphetamine (ADDERALL) 30 MG tablet Take 1 tablet by mouth 3 (three) times daily. 90 tablet 0  . atorvastatin (LIPITOR) 20 MG tablet Take 1 tablet (20 mg  total) by mouth daily. 90 tablet 3  . clobetasol cream (TEMOVATE) 3.61 % Apply 1 application topically 2 (two) times daily. 60 g 5  . Docusate Sodium (STOOL SOFTENER LAXATIVE PO) Take by mouth.    . famotidine (PEPCID) 40 MG tablet TAKE 1 TABLET BY MOUTH EVERYDAY AT BEDTIME 90 tablet 1  . FIBER PO Take by mouth.    Marland Kitchen glucose blood test strip Use as instructed to check blood sugar 100 each 12  . lisinopril (ZESTRIL) 5 MG tablet Take 1 tablet (5 mg total) by mouth daily. 90 tablet 3  . MELATONIN PO Take by mouth.    . Multiple Vitamin (MULTIVITAMIN) tablet Take 1 tablet by mouth daily.    Glory Rosebush Delica Lancets 44R MISC 1 each by Does not apply route daily. Check blood sugar daily 100 each 5  . PARoxetine (PAXIL) 40 MG tablet TAKE 1 TABLET BY MOUTH EVERY DAY IN THE MORNING 90 tablet 3  . pioglitazone-metformin (ACTOPLUS MET) 15-850 MG tablet Take 1 tablet by mouth 2 (two) times daily with a meal. 180 tablet 1  . propranolol (INDERAL) 80 MG tablet TAKE 1 TABLET (80 MG TOTAL) BY MOUTH 2 (TWO) TIMES DAILY. 200 tablet 3  . traZODone (DESYREL) 150  MG tablet TAKE 1 TABLET BY MOUTH EVERY DAY AT BEDTIME AS NEEDED FOR SLEEP 90 tablet 1  . XANAX 1 MG tablet Take 1 tablet (1 mg total) by mouth at bedtime as needed for anxiety. 30 tablet 1   No current facility-administered medications on file prior to visit.     The following portions of the patient's history were reviewed and updated as appropriate: allergies, current medications, past family history, past medical history, past social history, past surgical history and problem list.  ROS Otherwise as in subjective above  Objective: BP 110/76   Pulse 89   Ht $R'5\' 9"'ai$  (1.753 m)   Wt 177 lb 9.6 oz (80.6 kg)   SpO2 92%   BMI 26.23 kg/m   BP Readings from Last 3 Encounters:  12/05/20 110/76  09/03/20 122/70  05/21/20 108/64   General appearance: alert, no distress, well developed, well nourished Underneath chin is a linear 1.5 cm laceration  that has approximated and not gaping open.  There is some serous leakage at the wound but no induration, no fluctuance, no warmth, no erythema HEENT: normocephalic, sclerae anicteric, conjunctiva pink and moist, TMs pearly, nares patent, no discharge or erythema, pharynx normal Oral cavity: MMM, no lesions Neck: supple, no lymphadenopathy, no thyromegaly, no masses, no bruits Heart: RRR, normal S1, S2, no murmurs Lungs: CTA bilaterally, no wheezes, rhonchi, or rales Neuro: CN2-12 intact, nonfocal exam, no cerebellar signs Pulses: 2+ radial pulses, 2+ pedal pulses, normal cap refill Ext: no edema   Assessment: Encounter Diagnoses  Name Primary?  . Vasovagal attack Yes  . Type 2 diabetes mellitus with hyperglycemia, without long-term current use of insulin ([place])   . Fall, initial encounter   . Chin laceration, initial encounter   . Other migraine without status migrainosus, not intractable      Plan: discussed fall, laceration which is healing ok at this point, no gaping wound or sign of infection.  Discussed possible reasons why he might get lightheaded when he got up abruptly that night such as the fact he takes propranolol at bed time, symptoms seem vasovagal related, his BP is on low normal side, and can't rule out low sugars.  Discussed glucose monitoring.    discussed recommendations below  Your recent fall likely was related to vasovagal drop in blood pressure  Recommendations:  Change your Actoplusmet to 1/2 tablet daily for a week, then if tolerating this, change to 1/2 tablet twice daily  Hydrate with 80-100 ounces of water daily  Avoid sudden movements from lying to sitting or standing position  Be more cautious particularly if you get up at night to use the restroom  Continue the propranolol like usual for now since this is a long term medication  Consider taking a probiotic daily  Avoid or limit greasy, spicy foods and foods that gas you up such as onions and  pinto beans  Keep your chin clean with soap and water  Consider using neosporin topically or triple antibiotic ointment for the next 4-5 days  If any signs of pus, redness, warmth or swelling, then begin oral antibiotic sent to pharmacy  If still not tolerating Actoplusmet within 3-4 weeks, then follow up with Dr. Redmond School  Check your fating sugars daily and check glucose anytime you feel like headed, dizzy, or shaky  [place] was seen today for fall.  Diagnoses and all orders for this visit:  Vasovagal attack -     Orthostatic vital signs  Type 2 diabetes mellitus with  hyperglycemia, without long-term current use of insulin (HCC) -     Orthostatic vital signs  Fall, initial encounter  Chin laceration, initial encounter  Other migraine without status migrainosus, not intractable  Other orders -     cephALEXin (KEFLEX) 500 MG capsule; Take 1 capsule (500 mg total) by mouth 3 (three) times daily. -     diphenoxylate-atropine (LOMOTIL) 2.5-0.025 MG tablet; Take 1 tablet by mouth 2 (two) times daily as needed for diarrhea or loose stools.    Follow up: 3-4 weeks

## 2021-01-12 ENCOUNTER — Encounter: Payer: Self-pay | Admitting: Family Medicine

## 2021-01-12 DIAGNOSIS — F988 Other specified behavioral and emotional disorders with onset usually occurring in childhood and adolescence: Secondary | ICD-10-CM

## 2021-01-12 MED ORDER — AMPHETAMINE-DEXTROAMPHETAMINE 30 MG PO TABS: 30.0000 mg | ORAL_TABLET | Freq: Three times a day (TID) | ORAL | 0 refills | Status: DC

## 2021-01-12 MED ORDER — AMPHETAMINE-DEXTROAMPHETAMINE 30 MG PO TABS
30.0000 mg | ORAL_TABLET | Freq: Three times a day (TID) | ORAL | 0 refills | Status: DC
Start: 2021-02-12 — End: 2021-04-08

## 2021-01-15 ENCOUNTER — Telehealth: Payer: Self-pay

## 2021-01-15 NOTE — Telephone Encounter (Signed)
P.A. ADDERALL completed response says pt has access to medication and P.A. is not needed for this medication

## 2021-01-16 NOTE — Telephone Encounter (Signed)
Called pharmacy & went thru for $1.35 & also checked on Trazodone & it went thru for $1.35 also.  No problems with anything at this time.  Called pt and informed

## 2021-02-10 ENCOUNTER — Encounter: Payer: Self-pay | Admitting: Family Medicine

## 2021-02-14 ENCOUNTER — Other Ambulatory Visit: Payer: Self-pay | Admitting: Family Medicine

## 2021-02-14 DIAGNOSIS — E1169 Type 2 diabetes mellitus with other specified complication: Secondary | ICD-10-CM

## 2021-02-27 DIAGNOSIS — I10 Essential (primary) hypertension: Secondary | ICD-10-CM | POA: Diagnosis not present

## 2021-02-27 DIAGNOSIS — Z7984 Long term (current) use of oral hypoglycemic drugs: Secondary | ICD-10-CM | POA: Diagnosis not present

## 2021-02-27 DIAGNOSIS — R69 Illness, unspecified: Secondary | ICD-10-CM | POA: Diagnosis not present

## 2021-02-27 DIAGNOSIS — F909 Attention-deficit hyperactivity disorder, unspecified type: Secondary | ICD-10-CM | POA: Diagnosis not present

## 2021-02-27 DIAGNOSIS — G43909 Migraine, unspecified, not intractable, without status migrainosus: Secondary | ICD-10-CM | POA: Diagnosis not present

## 2021-02-27 DIAGNOSIS — E1165 Type 2 diabetes mellitus with hyperglycemia: Secondary | ICD-10-CM | POA: Diagnosis not present

## 2021-02-27 DIAGNOSIS — F419 Anxiety disorder, unspecified: Secondary | ICD-10-CM | POA: Diagnosis not present

## 2021-02-27 DIAGNOSIS — E785 Hyperlipidemia, unspecified: Secondary | ICD-10-CM | POA: Diagnosis not present

## 2021-02-27 DIAGNOSIS — K219 Gastro-esophageal reflux disease without esophagitis: Secondary | ICD-10-CM | POA: Diagnosis not present

## 2021-02-27 DIAGNOSIS — Z008 Encounter for other general examination: Secondary | ICD-10-CM | POA: Diagnosis not present

## 2021-02-27 DIAGNOSIS — G4700 Insomnia, unspecified: Secondary | ICD-10-CM | POA: Diagnosis not present

## 2021-04-07 ENCOUNTER — Encounter: Payer: Self-pay | Admitting: Family Medicine

## 2021-04-07 DIAGNOSIS — F988 Other specified behavioral and emotional disorders with onset usually occurring in childhood and adolescence: Secondary | ICD-10-CM

## 2021-04-08 MED ORDER — AMPHETAMINE-DEXTROAMPHETAMINE 30 MG PO TABS: 30.0000 mg | ORAL_TABLET | Freq: Three times a day (TID) | ORAL | 0 refills | Status: DC

## 2021-04-13 ENCOUNTER — Encounter: Payer: Self-pay | Admitting: Family Medicine

## 2021-04-21 ENCOUNTER — Encounter: Payer: Self-pay | Admitting: Family Medicine

## 2021-04-21 ENCOUNTER — Ambulatory Visit (INDEPENDENT_AMBULATORY_CARE_PROVIDER_SITE_OTHER): Payer: Medicare HMO | Admitting: Family Medicine

## 2021-04-21 VITALS — BP 112/76 | HR 86 | Temp 96.0°F | Wt 177.8 lb

## 2021-04-21 DIAGNOSIS — E1169 Type 2 diabetes mellitus with other specified complication: Secondary | ICD-10-CM | POA: Diagnosis not present

## 2021-04-21 DIAGNOSIS — F341 Dysthymic disorder: Secondary | ICD-10-CM

## 2021-04-21 DIAGNOSIS — T383X5A Adverse effect of insulin and oral hypoglycemic [antidiabetic] drugs, initial encounter: Secondary | ICD-10-CM | POA: Insufficient documentation

## 2021-04-21 DIAGNOSIS — R69 Illness, unspecified: Secondary | ICD-10-CM | POA: Diagnosis not present

## 2021-04-21 MED ORDER — PIOGLITAZONE HCL 30 MG PO TABS: 30.0000 mg | ORAL_TABLET | Freq: Every day | ORAL | 1 refills | Status: DC

## 2021-04-21 MED ORDER — CLONAZEPAM 0.5 MG PO TABS: 0.5000 mg | ORAL_TABLET | Freq: Two times a day (BID) | ORAL | 1 refills | Status: DC | PRN

## 2021-04-21 NOTE — Progress Notes (Signed)
   Subjective:    Patient ID: Dakota Gray, male    DOB: October 29, 1951, 70 y.o.   MRN: [id number]  HPI He is here for consult concerning multiple issues.  He has diabetes and has been trying to take metformin however he continues have difficulty with diarrhea from this.  He has been taking as low as one half a pill per day but still having trouble.  He does intermittently check his blood sugars. His other concern is continued difficulty with anxiety.  He has been on Paxil but has been using Xanax occasionally but has not had any recently.  He did get some Klonopin from his sister which apparently helped with his anxiety.  He would like to be placed on this.   Review of Systems     Objective:   Physical Exam Alert and in no distress otherwise not examined       Assessment & Plan:  -Adverse effect of metformin, initial encounter - Plan: pioglitazone (ACTOS) 30 MG tablet  Type 2 diabetes mellitus with other specified complication, without long-term current use of insulin (HCC)  Dysthymia He is to stop the metformin and I will switch him to Actos.  He is to keep track of his blood sugars let me know how this works.  He will check on the cost of Farxiga with his insurance. I discussed the anxiety and the underlying history of dysthymia with him.  He is to taper off the Paxil going down to 20 mg for 2 weeks and then stop.  Sample of Trintellix given to be started at 5 mg for 2 weeks and then increasing to 10 mg.  He is scheduled to come back here in June.  A small prescription for Klonopin given to help with anxiety.  I explained that I want to get him on a medication such that we do not need to use Klonopin or any other antianxiety benzodiazepine and therefore the reason of using Trintellix. Greater than 30 minutes spent discussing these issues with him.

## 2021-04-21 NOTE — Patient Instructions (Signed)
Break the Paxil in half and take a half a pill for the next 2 weeks then stop.  Go ahead and start a new medication today

## 2021-04-22 ENCOUNTER — Encounter: Payer: Self-pay | Admitting: Family Medicine

## 2021-05-09 ENCOUNTER — Encounter: Payer: Self-pay | Admitting: Family Medicine

## 2021-05-13 ENCOUNTER — Encounter: Payer: Self-pay | Admitting: Family Medicine

## 2021-05-14 ENCOUNTER — Encounter: Payer: Self-pay | Admitting: Family Medicine

## 2021-05-14 MED ORDER — METFORMIN HCL ER (OSM) 500 MG PO TB24: 500.0000 mg | ORAL_TABLET | Freq: Every day | ORAL | 1 refills | Status: DC

## 2021-05-15 ENCOUNTER — Other Ambulatory Visit: Payer: Self-pay | Admitting: Family Medicine

## 2021-05-15 ENCOUNTER — Encounter: Payer: Self-pay | Admitting: Family Medicine

## 2021-05-15 DIAGNOSIS — F341 Dysthymic disorder: Secondary | ICD-10-CM

## 2021-05-15 DIAGNOSIS — F332 Major depressive disorder, recurrent severe without psychotic features: Secondary | ICD-10-CM

## 2021-05-15 MED ORDER — METFORMIN HCL ER 500 MG PO TB24: 500.0000 mg | ORAL_TABLET | Freq: Every day | ORAL | 1 refills | Status: DC

## 2021-05-15 MED ORDER — VORTIOXETINE HBR 10 MG PO TABS
10.0000 mg | ORAL_TABLET | Freq: Every day | ORAL | 2 refills | Status: DC
Start: 2021-05-15 — End: 2021-05-27

## 2021-05-15 NOTE — Addendum Note (Signed)
Addended by: Ronnald Nian on: 05/15/2021 01:09 PM   Modules accepted: Orders

## 2021-05-15 NOTE — Telephone Encounter (Signed)
[facility] is requesting to fill pt paxil. Please advise [facility]

## 2021-05-18 ENCOUNTER — Telehealth: Payer: Self-pay

## 2021-05-18 NOTE — Telephone Encounter (Signed)
Recv'd P.A. for Metformin [facility] ([facility]) called pt looked at notes and Dr. Susann Givens has switched him to a different Metformin.  Called pt and he has gotten the correct Metformin. He will call back if has any more issues

## 2021-05-19 ENCOUNTER — Telehealth: Payer: Self-pay

## 2021-05-19 NOTE — Telephone Encounter (Signed)
Pt parmacy has given him glucophage XR 05/15/21. This med is more affordable for pt. [facility]

## 2021-05-22 ENCOUNTER — Ambulatory Visit: Payer: Self-pay | Admitting: Family Medicine

## 2021-05-27 ENCOUNTER — Ambulatory Visit (INDEPENDENT_AMBULATORY_CARE_PROVIDER_SITE_OTHER): Payer: Medicare HMO | Admitting: Family Medicine

## 2021-05-27 ENCOUNTER — Other Ambulatory Visit: Payer: Self-pay

## 2021-05-27 ENCOUNTER — Encounter: Payer: Self-pay | Admitting: Family Medicine

## 2021-05-27 VITALS — BP 128/82 | HR 89 | Temp 98.3°F | Ht 68.0 in | Wt 176.2 lb

## 2021-05-27 DIAGNOSIS — G43001 Migraine without aura, not intractable, with status migrainosus: Secondary | ICD-10-CM

## 2021-05-27 DIAGNOSIS — Z87891 Personal history of nicotine dependence: Secondary | ICD-10-CM

## 2021-05-27 DIAGNOSIS — E1169 Type 2 diabetes mellitus with other specified complication: Secondary | ICD-10-CM | POA: Diagnosis not present

## 2021-05-27 DIAGNOSIS — G43809 Other migraine, not intractable, without status migrainosus: Secondary | ICD-10-CM | POA: Diagnosis not present

## 2021-05-27 DIAGNOSIS — Z23 Encounter for immunization: Secondary | ICD-10-CM

## 2021-05-27 DIAGNOSIS — F988 Other specified behavioral and emotional disorders with onset usually occurring in childhood and adolescence: Secondary | ICD-10-CM

## 2021-05-27 DIAGNOSIS — T383X5D Adverse effect of insulin and oral hypoglycemic [antidiabetic] drugs, subsequent encounter: Secondary | ICD-10-CM

## 2021-05-27 DIAGNOSIS — Z0000 Encounter for general adult medical examination without abnormal findings: Secondary | ICD-10-CM

## 2021-05-27 DIAGNOSIS — E785 Hyperlipidemia, unspecified: Secondary | ICD-10-CM

## 2021-05-27 DIAGNOSIS — Z8042 Family history of malignant neoplasm of prostate: Secondary | ICD-10-CM | POA: Diagnosis not present

## 2021-05-27 DIAGNOSIS — R69 Illness, unspecified: Secondary | ICD-10-CM | POA: Diagnosis not present

## 2021-05-27 DIAGNOSIS — F341 Dysthymic disorder: Secondary | ICD-10-CM

## 2021-05-27 LAB — POCT GLYCOSYLATED HEMOGLOBIN (HGB A1C): Hemoglobin A1C: 6.9 % — AB (ref 4.0–5.6)

## 2021-05-27 MED ORDER — XANAX 1 MG PO TABS: 1.0000 mg | ORAL_TABLET | Freq: Every evening | ORAL | 1 refills | Status: DC | PRN

## 2021-05-27 MED ORDER — VORTIOXETINE HBR 20 MG PO TABS: 20.0000 mg | ORAL_TABLET | Freq: Every day | ORAL | 0 refills | Status: DC

## 2021-05-27 NOTE — Progress Notes (Signed)
Dakota Gray is a 70 y.o. male who presents for annual wellness visit ,CPE and follow-up on chronic medical conditions.  He has multiple concerns.  He does need follow-up on colon cancer screening and is trying to decide between Cologuard and getting a colonoscopy.  He has issues with getting someone to pick him up so he will need to get that resolved.  He complains of night sweats but says that adding extra salt to his diet has helped.  He does complain of some lesions on his face that he has questions about.  He does have underlying diabetes but does find that metformin XR 500 mg seems to be tolerated without GI issues.  He presently is taking Trintellix for the last month at 10 mg.  He is thinking its not having much of a benefit.  He would like a more Xanax to help with the anxiety.  He is supposed to be getting involved in counseling but so far he has not pursued this.  Continues to do well on his present ADHD medications.  He is also taking meds for his migraine and having no difficulty with that.   Immunizations and Health Maintenance Immunization History  Administered Date(s) Administered  . Fluad Quad(high Dose 65+) 10/02/2019, 09/03/2020  . Influenza Split 10/12/2000, 10/30/2001, 11/07/2002, 11/17/2014  . Influenza, High Dose Seasonal PF 10/26/2018  . Influenza,inj,Quad PF,6+ Mos 10/11/2013, 10/16/2015  . PFIZER(Purple Top)SARS-COV-2 Vaccination 02/25/2020, 03/25/2020, 11/13/2020  . Pneumococcal Conjugate-13 03/28/2018  . Pneumococcal Polysaccharide-23 07/19/2019  . Tdap 05/08/2013  . Zoster Recombinat (Shingrix) 05/24/2017, 09/14/2017  . Zoster, Live 07/28/2007   There are no preventive care reminders to display for this patient.  Last colonoscopy: cologuard 05/17/18 Last PSA: 05/21/20 Dentist:  Q year Ophtho: Q year and a half Exercise: walking three days a week   Other doctors caring for patient include: Dr. Sharlot Gowda eye,   Advanced Directives: Does Patient Have a Medical Advance  Directive?: Yes Type of Advance Directive: Living will Does patient want to make changes to medical advance directive?: Yes (MAU/Ambulatory/Procedural Areas - Information given) Copy asked for Depression screen:  See questionnaire below.     Depression screen [facility] 2/9 05/27/2021 05/21/2020 03/28/2018  Decreased Interest 3 1 0  Down, Depressed, Hopeless 0 2 0  PHQ - 2 Score 3 3 0  Altered sleeping 3 1 -  Tired, decreased energy 3 3 -  Change in appetite 3 0 -  Feeling bad or failure about yourself  0 1 -  Trouble concentrating 0 0 -  Moving slowly or fidgety/restless 0 0 -  Suicidal thoughts 0 0 -  PHQ-9 Score 12 8 -  Difficult doing work/chores Somewhat difficult Somewhat difficult -    Fall Screen: See Questionaire below.   Fall Risk  05/27/2021 05/21/2020 03/28/2018  Falls in the past year? 1 1 Yes  Number falls in past yr: 0 1 1  Injury with Fall? 0 0 No  Risk for fall due to : Impaired balance/gait - -  Follow up Falls evaluation completed - -    ADL screen:  See questionnaire below.  Functional Status Survey: Is the patient deaf or have difficulty hearing?: Yes Does the patient have difficulty seeing, even when wearing glasses/contacts?: No Does the patient have difficulty concentrating, remembering, or making decisions?: No Does the patient have difficulty walking or climbing stairs?: No Does the patient have difficulty dressing or bathing?: No Does the patient have difficulty doing errands alone such as visiting a doctor's office  or shopping?: No   Review of Systems  Constitutional: -, -unexpected weight change, -anorexia, -fatigue Allergy: -sneezing, -itching, -congestion Dermatology: denies changing moles, rash, lumps ENT: -runny nose, -ear pain, -sore throat,  Cardiology:  -chest pain, -palpitations, -orthopnea, Respiratory: -cough, -shortness of breath, -dyspnea on exertion, -wheezing,  Gastroenterology: -abdominal pain, -nausea, -vomiting, -diarrhea, -constipation,  -dysphagia Hematology: -bleeding or bruising problems Musculoskeletal: -arthralgias, -myalgias, -joint swelling, -back pain, - Ophthalmology: -vision changes,  Urology: -dysuria, -difficulty urinating,  -urinary frequency, -urgency, incontinence Neurology: -, -numbness, , -memory loss, -falls, -dizziness    PHYSICAL EXAM:   General Appearance: Alert, cooperative, no distress, appears stated age Head: Normocephalic, without obvious abnormality, atraumatic Eyes: PERRL, conjunctiva/corneas clear, EOM's intact,  Ears: Normal TM's and external ear canals Nose: Nares normal, mucosa normal, no drainage or sinus   tenderness Throat: Lips, mucosa, and tongue normal; teeth and gums normal Neck: Supple, no lymphadenopathy, thyroid:no enlargement/tenderness/nodules; no carotid bruit or JVD Lungs: Clear to auscultation bilaterally without wheezes, rales or ronchi; respirations unlabored Heart: Regular rate and rhythm, S1 and S2 normal, no murmur, rub or gallop Abdomen: Soft, non-tender, nondistended, normoactive bowel sounds, no masses, no hepatosplenomegaly Extremities: No clubbing, cyanosis or edema.  Diabetic foot exam is normal. Pulses: 2+ and symmetric all extremities Skin: Skin color, texture, turgor normal, no rashes or lesions Lymph nodes: Cervical, supraclavicular, and axillary nodes normal Neurologic: CNII-XII intact, normal strength, sensation and gait; reflexes 2+ and symmetric throughout   Psych: Normal mood, affect, hygiene and grooming  ASSESSMENT/PLAN: Routine general medical examination at a health care facility - Plan: Comprehensive metabolic panel, CBC with Differential/Platelet, Lipid panel  Type 2 diabetes mellitus with other specified complication, without long-term current use of insulin (HCC) - Plan: Comprehensive metabolic panel, CBC with Differential/Platelet, Lipid panel, POCT glycosylated hemoglobin (Hb A1C)  Dysthymia - Plan: vortioxetine HBr (TRINTELLIX) 20 MG TABS  tablet, XANAX 1 MG tablet  Attention deficit disorder, unspecified hyperactivity presence  Adverse effect of metformin, subsequent encounter  Former smoker  Migraine without aura and with status migrainosus, not intractable  Family history of prostate cancer in father  Hyperlipidemia associated with type 2 diabetes mellitus (HCC) - Plan: Lipid panel  Immunization, viral disease - Plan: PFIZER Comirnaty(GRAY TOP)COVID-19 Vaccine  Other migraine without status migrainosus, not intractable Samples of Trintellix 5 mg given.  He is to go up to 15 mg for at least 2 weeks and then switch over to the 20 mg tablet he will then call me in roughly a month to let me know how he is doing.  Small amount of Xanax given to help with anxiety until he stabilizes.  Continue on his present metformin regimen.  Discussed the sweats with him but at this time he is controlling it with salt so no further intervention needed.  He did mention something about his face and I explained that it is probably small cystic lesions and can refer him to dermatology when he wants to go.  He will otherwise continue on his present medication regimen.     Immunization recommendations discussed.  Colonoscopy recommendations reviewed.   Medicare Attestation I have personally reviewed: The patient's medical and social history Their use of alcohol, tobacco or illicit drugs Their current medications and supplements The patient's functional ability including ADLs,fall risks, home safety risks, cognitive, and hearing and visual impairment Diet and physical activities Evidence for depression or mood disorders  The patient's weight, height, and BMI have been recorded in the chart.  I have made referrals, counseling,  and provided education to the patient based on review of the above and I have provided the patient with a written personalized care plan for preventive services.     Sharlot Gowda, MD   05/27/2021

## 2021-05-27 NOTE — Patient Instructions (Signed)
  Mr. Winterrowd , Thank you for taking time to come for your Medicare Wellness Visit. I appreciate your ongoing commitment to your health goals. Please review the following plan we discussed and let me know if I can assist you in the future.   These are the goals we discussed: Increase her Trintellix to 15 mg for the next week and then start taking the 20 mg This is a list of the screening recommended for you and due dates:  Health Maintenance  Topic Date Due  . Hemoglobin A1C  05/27/2021*  . Eye exam for diabetics  05/27/2021*  . Cologuard (Stool DNA test)  05/27/2021*  . Flu Shot  07/27/2021  . Complete foot exam   05/27/2022  . Tetanus Vaccine  05/09/2023  . COVID-19 Vaccine  Completed  . Hepatitis C Screening: USPSTF Recommendation to screen - Ages 54-79 yo.  Completed  . Pneumonia vaccines  Completed  . Zoster (Shingles) Vaccine  Completed  . HPV Vaccine  Aged Out  *Topic was postponed. The date shown is not the original due date.

## 2021-05-28 LAB — LIPID PANEL
Chol/HDL Ratio: 2.9 ratio (ref 0.0–5.0)
Cholesterol, Total: 147 mg/dL (ref 100–199)
HDL: 51 mg/dL (ref >39)
LDL Chol Calc (NIH): 77 mg/dL (ref 0–99)
Triglycerides: 102 mg/dL (ref 0–149)
VLDL Cholesterol Cal: 19 mg/dL (ref 5–40)

## 2021-05-28 LAB — CBC WITH DIFFERENTIAL/PLATELET
Basophils Absolute: 0.1 10*3/uL (ref 0.0–0.2)
Basos: 1 %
EOS (ABSOLUTE): 0.1 10*3/uL (ref 0.0–0.4)
Eos: 2 %
Hematocrit: 43.9 % (ref 37.5–51.0)
Hemoglobin: 14.4 g/dL (ref 13.0–17.7)
Immature Grans (Abs): 0 10*3/uL (ref 0.0–0.1)
Immature Granulocytes: 1 %
Lymphocytes Absolute: 1.9 10*3/uL (ref 0.7–3.1)
Lymphs: 26 %
MCH: 29.3 pg (ref 26.6–33.0)
MCHC: 32.8 g/dL (ref 31.5–35.7)
MCV: 89 fL (ref 79–97)
Monocytes Absolute: 0.5 10*3/uL (ref 0.1–0.9)
Monocytes: 7 %
Neutrophils Absolute: 4.6 10*3/uL (ref 1.4–7.0)
Neutrophils: 63 %
Platelets: 214 10*3/uL (ref 150–450)
RBC: 4.91 x10E6/uL (ref 4.14–5.80)
RDW: 12 % (ref 11.6–15.4)
WBC: 7.3 10*3/uL (ref 3.4–10.8)

## 2021-05-28 LAB — COMPREHENSIVE METABOLIC PANEL
ALT: 19 IU/L (ref 0–44)
AST: 23 IU/L (ref 0–40)
Albumin/Globulin Ratio: 2.2 (ref 1.2–2.2)
Albumin: 4.7 g/dL (ref 3.8–4.8)
Alkaline Phosphatase: 105 IU/L (ref 44–121)
BUN/Creatinine Ratio: 16 (ref 10–24)
BUN: 14 mg/dL (ref 8–27)
Bilirubin Total: 0.5 mg/dL (ref 0.0–1.2)
CO2: 21 mmol/L (ref 20–29)
Calcium: 9.4 mg/dL (ref 8.6–10.2)
Chloride: 103 mmol/L (ref 96–106)
Creatinine, Ser: 0.89 mg/dL (ref 0.76–1.27)
Globulin, Total: 2.1 g/dL (ref 1.5–4.5)
Glucose: 159 mg/dL — ABNORMAL HIGH (ref 65–99)
Potassium: 4.6 mmol/L (ref 3.5–5.2)
Sodium: 139 mmol/L (ref 134–144)
Total Protein: 6.8 g/dL (ref 6.0–8.5)
eGFR: 93 mL/min/{1.73_m2} (ref >59)

## 2021-06-02 ENCOUNTER — Encounter: Payer: Self-pay | Admitting: Family Medicine

## 2021-06-02 DIAGNOSIS — F341 Dysthymic disorder: Secondary | ICD-10-CM

## 2021-06-03 MED ORDER — ALPRAZOLAM 1 MG PO TABS: 1.0000 mg | ORAL_TABLET | Freq: Every evening | ORAL | 1 refills | Status: DC | PRN

## 2021-06-06 ENCOUNTER — Telehealth: Payer: Self-pay

## 2021-06-06 NOTE — Telephone Encounter (Signed)
P.A. [place] completed and approved, pt informed

## 2021-06-11 LAB — HM DIABETES EYE EXAM

## 2021-06-15 ENCOUNTER — Encounter: Payer: Self-pay | Admitting: Family Medicine

## 2021-06-17 ENCOUNTER — Other Ambulatory Visit: Payer: Self-pay

## 2021-06-17 ENCOUNTER — Encounter: Payer: Self-pay | Admitting: Family Medicine

## 2021-06-17 DIAGNOSIS — E1169 Type 2 diabetes mellitus with other specified complication: Secondary | ICD-10-CM

## 2021-06-17 DIAGNOSIS — Z1211 Encounter for screening for malignant neoplasm of colon: Secondary | ICD-10-CM

## 2021-06-17 MED ORDER — METFORMIN HCL ER 500 MG PO TB24: 500.0000 mg | ORAL_TABLET | Freq: Every day | ORAL | 0 refills | Status: DC

## 2021-06-17 MED ORDER — METFORMIN HCL ER 500 MG PO TB24: 500.0000 mg | ORAL_TABLET | Freq: Every day | ORAL | 1 refills | Status: DC

## 2021-06-22 ENCOUNTER — Encounter: Payer: Self-pay | Admitting: Family Medicine

## 2021-06-25 DIAGNOSIS — Z1211 Encounter for screening for malignant neoplasm of colon: Secondary | ICD-10-CM | POA: Diagnosis not present

## 2021-06-26 LAB — COLOGUARD: Cologuard: NEGATIVE

## 2021-06-29 ENCOUNTER — Other Ambulatory Visit: Payer: Self-pay | Admitting: Family Medicine

## 2021-06-29 DIAGNOSIS — F341 Dysthymic disorder: Secondary | ICD-10-CM

## 2021-06-30 NOTE — Telephone Encounter (Signed)
[facility] is requesting to fill pt trintellix. Please advise [facility] 

## 2021-07-01 LAB — COLOGUARD: COLOGUARD: NEGATIVE

## 2021-07-01 LAB — EXTERNAL GENERIC LAB PROCEDURE: COLOGUARD: NEGATIVE

## 2021-07-06 ENCOUNTER — Encounter: Payer: Self-pay | Admitting: Family Medicine

## 2021-07-06 DIAGNOSIS — Z79899 Other long term (current) drug therapy: Secondary | ICD-10-CM

## 2021-07-19 ENCOUNTER — Other Ambulatory Visit: Payer: Self-pay | Admitting: Family Medicine

## 2021-07-19 DIAGNOSIS — F341 Dysthymic disorder: Secondary | ICD-10-CM

## 2021-07-19 DIAGNOSIS — F332 Major depressive disorder, recurrent severe without psychotic features: Secondary | ICD-10-CM

## 2021-07-25 ENCOUNTER — Encounter: Payer: Self-pay | Admitting: Family Medicine

## 2021-07-25 ENCOUNTER — Other Ambulatory Visit: Payer: Self-pay | Admitting: Family Medicine

## 2021-07-25 DIAGNOSIS — G43001 Migraine without aura, not intractable, with status migrainosus: Secondary | ICD-10-CM

## 2021-07-25 DIAGNOSIS — F988 Other specified behavioral and emotional disorders with onset usually occurring in childhood and adolescence: Secondary | ICD-10-CM

## 2021-07-27 MED ORDER — AMPHETAMINE-DEXTROAMPHETAMINE 30 MG PO TABS: 30.0000 mg | ORAL_TABLET | Freq: Three times a day (TID) | ORAL | 0 refills | Status: DC

## 2021-07-27 MED ORDER — PROPRANOLOL HCL 80 MG PO TABS: ORAL_TABLET | ORAL | 3 refills | Status: AC

## 2021-07-27 NOTE — Telephone Encounter (Signed)
[facility] is requesting to fill pt adderall. Please advise KH 

## 2021-08-04 ENCOUNTER — Encounter: Payer: Self-pay | Admitting: Family Medicine

## 2021-08-06 ENCOUNTER — Other Ambulatory Visit: Payer: Self-pay | Admitting: Family Medicine

## 2021-08-06 DIAGNOSIS — F341 Dysthymic disorder: Secondary | ICD-10-CM

## 2021-08-06 MED ORDER — ALPRAZOLAM 1 MG PO TABS: 1.0000 mg | ORAL_TABLET | Freq: Every evening | ORAL | 1 refills | Status: AC | PRN

## 2021-08-08 ENCOUNTER — Encounter: Payer: Self-pay | Admitting: Family Medicine

## 2021-08-10 ENCOUNTER — Encounter: Payer: Self-pay | Admitting: Family Medicine

## 2021-08-18 ENCOUNTER — Other Ambulatory Visit: Payer: Self-pay | Admitting: Family Medicine

## 2021-08-18 ENCOUNTER — Encounter: Payer: Self-pay | Admitting: Internal Medicine

## 2021-08-18 DIAGNOSIS — E1169 Type 2 diabetes mellitus with other specified complication: Secondary | ICD-10-CM

## 2021-08-18 NOTE — Telephone Encounter (Signed)
I have called and left message with [facility] referral coordinator to give me a call or call the patient to schedule.

## 2021-09-14 ENCOUNTER — Other Ambulatory Visit (INDEPENDENT_AMBULATORY_CARE_PROVIDER_SITE_OTHER): Payer: Medicare HMO

## 2021-09-14 ENCOUNTER — Other Ambulatory Visit: Payer: Self-pay

## 2021-09-14 DIAGNOSIS — Z23 Encounter for immunization: Secondary | ICD-10-CM

## 2021-09-15 ENCOUNTER — Encounter: Payer: Self-pay | Admitting: Family Medicine

## 2021-09-15 DIAGNOSIS — F349 Persistent mood [affective] disorder, unspecified: Secondary | ICD-10-CM | POA: Diagnosis not present

## 2021-09-15 DIAGNOSIS — F419 Anxiety disorder, unspecified: Secondary | ICD-10-CM | POA: Diagnosis not present

## 2021-09-15 DIAGNOSIS — R69 Illness, unspecified: Secondary | ICD-10-CM | POA: Diagnosis not present

## 2021-09-19 ENCOUNTER — Encounter: Payer: Self-pay | Admitting: Family Medicine

## 2021-10-01 ENCOUNTER — Encounter: Payer: Self-pay | Admitting: Family Medicine

## 2021-10-02 ENCOUNTER — Ambulatory Visit (INDEPENDENT_AMBULATORY_CARE_PROVIDER_SITE_OTHER): Payer: Medicare HMO | Admitting: Medical

## 2021-10-02 ENCOUNTER — Other Ambulatory Visit: Payer: Self-pay

## 2021-10-02 VITALS — BP 120/82 | HR 82 | Wt 178.2 lb

## 2021-10-02 DIAGNOSIS — Z23 Encounter for immunization: Secondary | ICD-10-CM | POA: Diagnosis not present

## 2021-10-02 DIAGNOSIS — E1169 Type 2 diabetes mellitus with other specified complication: Secondary | ICD-10-CM | POA: Diagnosis not present

## 2021-10-02 DIAGNOSIS — G479 Sleep disorder, unspecified: Secondary | ICD-10-CM | POA: Diagnosis not present

## 2021-10-02 DIAGNOSIS — L089 Local infection of the skin and subcutaneous tissue, unspecified: Secondary | ICD-10-CM | POA: Diagnosis not present

## 2021-10-02 MED ORDER — CLONIDINE HCL 0.1 MG PO TABS: 0.1000 mg | ORAL_TABLET | Freq: Every day | ORAL | 0 refills | Status: DC

## 2021-10-02 MED ORDER — MUPIROCIN 2 % EX OINT: 1.0000 "application " | TOPICAL_OINTMENT | Freq: Two times a day (BID) | CUTANEOUS | 1 refills | Status: AC

## 2021-10-02 MED ORDER — HIBICLENS 4 % EX LIQD: Freq: Every day | CUTANEOUS | 2 refills | Status: AC | PRN

## 2021-10-02 MED ORDER — SULFAMETHOXAZOLE-TRIMETHOPRIM 800-160 MG PO TABS: 1.0000 | ORAL_TABLET | Freq: Two times a day (BID) | ORAL | 0 refills | Status: AC

## 2021-10-02 NOTE — Progress Notes (Signed)
Subjective:  Dakota Gray is a 70 y.o. male who presents for Chief Complaint  Patient presents with   boil in groin    Boil in groin since September 24th. It opened on September 27th and was fine. October 5th filled up and painful. October 6th- clear pus and sticky. redness     Here for skin infection, possible abscess.  He notes hx/o boils.   Most of the time he has boils on his face.  This time in the groin area.  He had 2 lesions on both inguinal folds but those are getting better.  Now he has 1 on the pubic area on the right that is draining.  No fever, no body aches or chills, no diarrhea.  No skin contacts with similar.  No history of MRSA.  No other aggravating or relieving factors.    Having trouble sleeping.  Has talked to Dr. Susann Givens before about this.  Not using OTC aid, wants to try something.  Has trouble staying asleep.  Gets to sleep ok.  No other c/o.  The following portions of the patient's history were reviewed and updated as appropriate: allergies, current medications, past family history, past medical history, past social history, past surgical history and problem list.  ROS Otherwise as in subjective above   Objective: BP 120/82   Pulse 82   Wt 178 lb 3.2 oz (80.8 kg)   BMI 27.10 kg/m   General appearance: alert, no distress, well developed, well nourished Skin: Right pubic region with 2 side-by-side areas of erythema around suggestive of boils or infected hair bumps, there is some slightly faint pinkish coloration and swelling in both innertrigonous regions this seems to be healing from likely recent boils as well    Assessment: Encounter Diagnoses  Name Primary?   Skin infection Yes   COVID-19 vaccine administered    Type 2 diabetes mellitus with other specified complication, without long-term current use of insulin (HCC)    Sleep disturbance      Plan: Skin infection, history of diabetes Begin antibiotic below by mouth Begin mupirocin ointment.   This is for 2 reasons.  You can use this on a Q-tip in each nostril twice daily for the next week to help clear out potential bacteria that may be hanging out in the nostrils which can lead to skin infections Also you can use topical mupirocin on the Q-tip from the actual skin lesions that are Begin Hibiclens body wash weekly for the next few weeks.  This is a head to toe body wash Use good hygiene with soap and water such as Dial antibacterial soap.  Make sure you use fresh towels and bath cloth each time.  Do not reuse towels or bath cloth before washing them again If not resolved or not improving over the next 3 to 4 days then call back for recheck   Counseled on the Covid virus vaccine.  Vaccine information sheet given.  Covid vaccine given after consent obtained.  Sleep disturbance - we discussed this briefly at end of visit.  I advised he come back talk to PCP further but in  our discussion I gave option of trial of clonidine off label QHS for sleep particularly in light of stimulant therapy, ADD, and other high risk medicaiton.  Advised that this really should be a PCP issue and he is here for acute issue, not chronic issue management.    General was seen today for boil in groin.  Diagnoses and all orders for  this visit:  Skin infection  COVID-19 vaccine administered Scientist, research (physical sciences) Booster  Type 2 diabetes mellitus with other specified complication, without long-term current use of insulin (HCC)  Sleep disturbance  Other orders -     sulfamethoxazole-trimethoprim (BACTRIM DS) 800-160 MG tablet; Take 1 tablet by mouth 2 (two) times daily. -     chlorhexidine (HIBICLENS) 4 % external liquid; Apply topically daily as needed. -     mupirocin ointment (BACTROBAN) 2 %; Apply 1 application topically 2 (two) times daily. -     cloNIDine (CATAPRES) 0.1 MG tablet; Take 1 tablet (0.1 mg total) by mouth at bedtime.   Follow up: prn

## 2021-10-02 NOTE — Patient Instructions (Signed)
Skin infection, history of diabetes Begin antibiotic below by mouth Begin mupirocin ointment.  This is for 2 reasons.  You can use this on a Q-tip in each nostril twice daily for the next week to help clear out potential bacteria that may be hanging out in the nostrils which can lead to skin infections Also you can use topical mupirocin on the Q-tip from the actual skin lesions that are Begin Hibiclens body wash weekly for the next few weeks.  This is a head to toe body wash Use good hygiene with soap and water such as Dial antibacterial soap.  Make sure you use fresh towels and bath cloth each time.  Do not reuse towels or bath cloth before washing them again If not resolved or not improving over the next 3 to 4 days then call back for recheck

## 2021-10-20 ENCOUNTER — Other Ambulatory Visit: Payer: Self-pay | Admitting: Family Medicine

## 2021-10-20 DIAGNOSIS — H52223 Regular astigmatism, bilateral: Secondary | ICD-10-CM | POA: Diagnosis not present

## 2021-10-20 DIAGNOSIS — E119 Type 2 diabetes mellitus without complications: Secondary | ICD-10-CM | POA: Diagnosis not present

## 2021-10-20 DIAGNOSIS — H5202 Hypermetropia, left eye: Secondary | ICD-10-CM | POA: Diagnosis not present

## 2021-10-20 DIAGNOSIS — H524 Presbyopia: Secondary | ICD-10-CM | POA: Diagnosis not present

## 2021-10-20 LAB — HM DIABETES EYE EXAM

## 2021-10-25 ENCOUNTER — Other Ambulatory Visit: Payer: Self-pay | Admitting: Family Medicine

## 2021-10-25 DIAGNOSIS — F988 Other specified behavioral and emotional disorders with onset usually occurring in childhood and adolescence: Secondary | ICD-10-CM

## 2021-10-26 MED ORDER — AMPHETAMINE-DEXTROAMPHETAMINE 30 MG PO TABS: 30.0000 mg | ORAL_TABLET | Freq: Three times a day (TID) | ORAL | 0 refills | Status: AC

## 2021-10-28 ENCOUNTER — Ambulatory Visit (INDEPENDENT_AMBULATORY_CARE_PROVIDER_SITE_OTHER): Payer: Medicare HMO | Admitting: Family Medicine

## 2021-10-28 ENCOUNTER — Encounter: Payer: Self-pay | Admitting: Family Medicine

## 2021-10-28 ENCOUNTER — Other Ambulatory Visit: Payer: Self-pay

## 2021-10-28 VITALS — BP 134/86 | HR 88 | Wt 179.6 lb

## 2021-10-28 DIAGNOSIS — F988 Other specified behavioral and emotional disorders with onset usually occurring in childhood and adolescence: Secondary | ICD-10-CM | POA: Diagnosis not present

## 2021-10-28 DIAGNOSIS — E785 Hyperlipidemia, unspecified: Secondary | ICD-10-CM | POA: Diagnosis not present

## 2021-10-28 DIAGNOSIS — E1169 Type 2 diabetes mellitus with other specified complication: Secondary | ICD-10-CM | POA: Diagnosis not present

## 2021-10-28 DIAGNOSIS — T383X5D Adverse effect of insulin and oral hypoglycemic [antidiabetic] drugs, subsequent encounter: Secondary | ICD-10-CM | POA: Diagnosis not present

## 2021-10-28 DIAGNOSIS — R69 Illness, unspecified: Secondary | ICD-10-CM | POA: Diagnosis not present

## 2021-10-28 DIAGNOSIS — F341 Dysthymic disorder: Secondary | ICD-10-CM | POA: Diagnosis not present

## 2021-10-28 LAB — POCT GLYCOSYLATED HEMOGLOBIN (HGB A1C): Hemoglobin A1C: 7.2 % — AB (ref 4.0–5.6)

## 2021-10-28 NOTE — Progress Notes (Addendum)
Subjective:    Patient ID: Dakota Gray, male    DOB: 21-Aug-1951, 70 y.o.   MRN: [id number]  Dakota Gray is a 70 y.o. male who presents for follow-up of Type 2 diabetes mellitus.  Home blood sugar records: fasting range: Not given Current symptoms/problems include none and have been unchanged. Daily foot checks:   Any foot concerns: None Exercise: The patient does not participate in regular exercise at present. Diet: Regular. He did try metformin but he had difficulty with diarrhea because of that.  He was then given pioglitazone but unfortunately he has not been taking it and it did not show up in the medical record.  He did state he took it for the last 3 days and I told him to stop taking it.  He has been intermittently taking his Lipitor.  He is now waking up with headaches and questions about whether he is continuing his Inderal initially said no and then apparently has truly been taking it.  He is cut back on his Adderall and is using it less than 3 times per day and seems to be doing okay on it.  Psychologically he is not on any medications other than Desyrel for sleep.He stopped them on his own  He made an attempt to get into see psychiatry but was unsuccessful with that.  He has not been taking his lisinopril thinking it caused constipation. The following portions of the patient's history were reviewed and updated as appropriate: allergies, current medications, past medical history, past social history and problem list.  ROS as in subjective above.     Objective:    Physical Exam Alert and in no distress otherwise not examined.  Hemoglobin A1c is 7.2  Lab Review Diabetic Labs Latest Ref Rng & Units 10/28/2021 05/27/2021 09/03/2020 05/21/2020 07/19/2019  HbA1c 4.0 - 5.6 % 7.2(A) 6.9(A) 8.1(A) 7.7(A) 6.6(H)  Chol 100 - 199 mg/dL - 488 891 694(H) 038(U)  HDL >39 mg/dL - 51 82(C) 00(L) 43  Calc LDL 0 - 99 mg/dL - 77 59 491(P) 915(A)  Triglycerides 0 - 149 mg/dL - 569 794(I) 016(P)  537(S)  Creatinine 0.76 - 1.27 mg/dL - 8.27 - 0.78 6.75   BP/Weight 10/28/2021 10/02/2021 05/27/2021 04/21/2021 12/05/2020  Systolic BP 134 120 128 112 110  Diastolic BP 86 82 82 76 76  Wt. (Lbs) 179.6 178.2 176.2 177.8 177.6  BMI 27.31 27.1 26.79 26.26 26.23  Some encounter information is confidential and restricted. Go to Review Flowsheets activity to see all data.   Foot/eye exam completion dates Latest Ref Rng & Units 10/20/2021 06/11/2021  Eye Exam No Retinopathy No Retinopathy No Retinopathy  Foot Form Completion - - -    Dam  reports that he quit smoking about 37 years ago. His smoking use included cigarettes. He has never used smokeless tobacco. He reports that he does not drink alcohol and does not use drugs.     Assessment & Plan:    Type 2 diabetes mellitus with other specified complication, without long-term current use of insulin (HCC) - Plan: HgB A1c  Adverse effect of metformin, subsequent encounter  Dysthymia  Attention deficit disorder, unspecified hyperactivity presence  Hyperlipidemia associated with type 2 diabetes mellitus (HCC)  Rx changes: none Education: Reviewed 'ABCs' of diabetes management (respective goals in parentheses):  A1C (<7), blood pressure (<130/80), and cholesterol (LDL <100). Compliance at present is estimated to be good. Efforts to improve compliance (if necessary) will be directed at dietary modifications: More frequent but smaller  meals. . Follow up: 4 months I will have him hold the pioglitazone since he is not really taking it and his A1c looks pretty good.  Did recommend he take the lisinopril as well as taking Lipitor on a regular basis.  Again encouraged him to have more frequent but smaller meals.   When he came he created quite a stir in front office demanding that his ballot to be notarized and we explained to him in no uncertain terms that it would be illegal for [place] to do that.

## 2021-10-29 ENCOUNTER — Telehealth: Payer: Self-pay | Admitting: Family Medicine

## 2021-10-29 ENCOUNTER — Encounter: Payer: Self-pay | Admitting: Family Medicine

## 2021-10-29 NOTE — Telephone Encounter (Signed)
Patient came in the office for an appointment on 10/28/21. He stopped at [facility] window to check in and asked if Lafonda Mosses was here today and Olegario Messier stated yes. Patient then said good I need her to notarize my ballot. 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 Pt then got upset and said, "if she refuses, I can have her license revoked'.  Olegario Messier tried to explain to him that we had just had a staff meeting and was informed of this law. He said, "I am aware of my rights and if she refuses, I can have her license revoked". At that time a patient in the lobby joined the conversation and was explaining to him that Olegario Messier was right, at that time Olegario Messier told me to go get Lafonda Mosses. I found Lafonda Mosses in her office and explained to her what was going on and she then told me to bring him back to her office and to stay with her while she spoke to him. Lafonda Mosses then asked the patient what his concern was. He told her that he had a ballot that he wanted her to notarize and that if she did not do It, he could have her licensed revoked. Lafonda Mosses told him that she didn't not like threats, and it was against the law for her to notarize his ballot because she is a Research scientist (physical sciences). The patient still wanted to argue with her, and she showed him the email explaining the law for healthcare workers and made him a copy of it. Pt then went back to the lobby to wait for his appointment with Dr. Susann Givens. Lafonda Mosses and myself went to Dr. Jola Babinski office and explained to him what had just happened.

## 2021-10-29 NOTE — Telephone Encounter (Signed)
Patient came into the office for his appointment and asked me if Lafonda Mosses was in the office today. I responded that she was. Patient then stated that he needed Lafonda Mosses to notarize his ballot. I informed the patient that as healthcare workers we were unable to [employer]. He then said that if she refused that he would have her license revoke. I again tried to explain to patient that we just had a meeting that day where we were informed of the law. Patient stated he was aware of his rights, and she could not refuse, and he would report her if she did.   Pt was visibly upset and a different patient sitting in the waiting room who overheard this interaction stated that she was aware of voting laws, was a notary too and what I was saying was indeed the truth.  Pt began arguing with the male patient in the lobby. At this point I turned to my coworker, Zenon Mayo and asked her to go get Lafonda Mosses, our Research officer, political party.  She did so. Patient then turned to me and asked if I would witness his signature. I explained to patient that I could not in anyway touch or help him with his ballot. Pt was agitated and the male patient in the lobby stated once again that the information was true and stated she would witness it for him as she was not a Research scientist (physical sciences) and was under no restrictions.  She then turned to another patient in the lobby and asked if they would be willing to sign as a witness too. At that point [facility] had reached the lobby and called him back to Diana's office.   After patients' appointment as he was leaving, I was bent down picking something up and he spoke to me. It startled me and we both laughed. Patient then informed me that the wipes at our hand sanitizer station was empty. I told the patient thank you and had no more interactions with patient. I did check wipes and it was not empty, just not pulling up and I fixed it.

## 2021-10-29 NOTE — Telephone Encounter (Signed)
Will do!

## 2021-10-29 NOTE — Telephone Encounter (Signed)
Reviewed

## 2021-10-29 NOTE — Telephone Encounter (Signed)
Pt came in for a visit.  While he was being checked in he asked to speak with me as he wanted me to Notarize his ballott. My front began telling him that we had just had a staff meeting where we were advised it is a Programme researcher, broadcasting/film/video for a Research scientist (physical sciences) to help a patient with a ballott.  Glen then involved a patient in the lobby, who was agreeing with the staff.  Glen then told the front he would have my Notary license revoked.  Sheena then came back to my office to ask for help with the patient.  I asked her to bring the patient back to my office and for her to come with him.  I asked the patient what he was talking about that he would have my license revoked.  He said that I was to notarize his ballot, that I was required to.  I explained that I would not, nor could not notarize his ballot.  He said you are a Notary and I said yes, that I am also a Research scientist (physical sciences) and it is a Class 1 Felony.  I explained to him that I did not appreciate the Threat that he made in the front office.  That I do not take kindly to any threats on my staff, my provider or myself. I gave him a copy of the notice regarding the Ballot.  He stumbled and studdered for a moment and then said he was sorry.  He asked should he go back to the lobby and I said yes.    After he finished with his appt, he came back by and knocked on my door and I said come in.  He said are we ok?  And I said yes.  He said sorry, I said that is fine, and he left.

## 2021-11-09 ENCOUNTER — Other Ambulatory Visit: Payer: Self-pay | Admitting: Family Medicine

## 2021-11-09 DIAGNOSIS — E1169 Type 2 diabetes mellitus with other specified complication: Secondary | ICD-10-CM

## 2021-11-09 DIAGNOSIS — F341 Dysthymic disorder: Secondary | ICD-10-CM

## 2021-11-11 MED ORDER — LISINOPRIL 5 MG PO TABS: 5.0000 mg | ORAL_TABLET | Freq: Every day | ORAL | 0 refills | Status: AC

## 2021-11-20 ENCOUNTER — Other Ambulatory Visit: Payer: Self-pay | Admitting: Family Medicine

## 2021-11-20 DIAGNOSIS — E1169 Type 2 diabetes mellitus with other specified complication: Secondary | ICD-10-CM

## 2022-01-06 DIAGNOSIS — R69 Illness, unspecified: Secondary | ICD-10-CM | POA: Diagnosis not present

## 2022-01-06 DIAGNOSIS — G4700 Insomnia, unspecified: Secondary | ICD-10-CM | POA: Diagnosis not present

## 2022-01-06 DIAGNOSIS — Z0000 Encounter for general adult medical examination without abnormal findings: Secondary | ICD-10-CM | POA: Diagnosis not present

## 2022-01-06 DIAGNOSIS — E785 Hyperlipidemia, unspecified: Secondary | ICD-10-CM | POA: Diagnosis not present

## 2022-01-06 DIAGNOSIS — Z23 Encounter for immunization: Secondary | ICD-10-CM | POA: Diagnosis not present

## 2022-01-20 ENCOUNTER — Other Ambulatory Visit: Payer: Self-pay | Admitting: Family Medicine

## 2022-01-20 DIAGNOSIS — F341 Dysthymic disorder: Secondary | ICD-10-CM

## 2022-01-20 DIAGNOSIS — F332 Major depressive disorder, recurrent severe without psychotic features: Secondary | ICD-10-CM

## 2022-03-10 DIAGNOSIS — Z125 Encounter for screening for malignant neoplasm of prostate: Secondary | ICD-10-CM | POA: Diagnosis not present

## 2022-03-10 DIAGNOSIS — L089 Local infection of the skin and subcutaneous tissue, unspecified: Secondary | ICD-10-CM | POA: Diagnosis not present

## 2022-03-10 DIAGNOSIS — G4700 Insomnia, unspecified: Secondary | ICD-10-CM | POA: Diagnosis not present

## 2022-03-10 DIAGNOSIS — E119 Type 2 diabetes mellitus without complications: Secondary | ICD-10-CM | POA: Diagnosis not present

## 2022-03-10 DIAGNOSIS — Z0000 Encounter for general adult medical examination without abnormal findings: Secondary | ICD-10-CM | POA: Diagnosis not present

## 2022-03-18 DIAGNOSIS — G4700 Insomnia, unspecified: Secondary | ICD-10-CM | POA: Diagnosis not present

## 2022-03-18 DIAGNOSIS — Z809 Family history of malignant neoplasm, unspecified: Secondary | ICD-10-CM | POA: Diagnosis not present

## 2022-03-18 DIAGNOSIS — K219 Gastro-esophageal reflux disease without esophagitis: Secondary | ICD-10-CM | POA: Diagnosis not present

## 2022-03-18 DIAGNOSIS — K5900 Constipation, unspecified: Secondary | ICD-10-CM | POA: Diagnosis not present

## 2022-03-18 DIAGNOSIS — N529 Male erectile dysfunction, unspecified: Secondary | ICD-10-CM | POA: Diagnosis not present

## 2022-03-18 DIAGNOSIS — R030 Elevated blood-pressure reading, without diagnosis of hypertension: Secondary | ICD-10-CM | POA: Diagnosis not present

## 2022-03-18 DIAGNOSIS — R69 Illness, unspecified: Secondary | ICD-10-CM | POA: Diagnosis not present

## 2022-03-18 DIAGNOSIS — E785 Hyperlipidemia, unspecified: Secondary | ICD-10-CM | POA: Diagnosis not present

## 2022-03-18 DIAGNOSIS — Z9109 Other allergy status, other than to drugs and biological substances: Secondary | ICD-10-CM | POA: Diagnosis not present

## 2022-03-18 DIAGNOSIS — Z87891 Personal history of nicotine dependence: Secondary | ICD-10-CM | POA: Diagnosis not present

## 2022-03-18 DIAGNOSIS — I951 Orthostatic hypotension: Secondary | ICD-10-CM | POA: Diagnosis not present

## 2022-04-19 DIAGNOSIS — R972 Elevated prostate specific antigen [PSA]: Secondary | ICD-10-CM | POA: Diagnosis not present

## 2022-05-10 ENCOUNTER — Other Ambulatory Visit: Payer: Self-pay | Admitting: Family Medicine

## 2022-05-10 DIAGNOSIS — E1169 Type 2 diabetes mellitus with other specified complication: Secondary | ICD-10-CM

## 2022-05-21 DIAGNOSIS — R972 Elevated prostate specific antigen [PSA]: Secondary | ICD-10-CM | POA: Diagnosis not present

## 2022-05-21 DIAGNOSIS — N5201 Erectile dysfunction due to arterial insufficiency: Secondary | ICD-10-CM | POA: Diagnosis not present

## 2022-06-16 DIAGNOSIS — R972 Elevated prostate specific antigen [PSA]: Secondary | ICD-10-CM | POA: Diagnosis not present

## 2022-06-16 DIAGNOSIS — R350 Frequency of micturition: Secondary | ICD-10-CM | POA: Diagnosis not present

## 2022-06-16 DIAGNOSIS — E119 Type 2 diabetes mellitus without complications: Secondary | ICD-10-CM | POA: Diagnosis not present

## 2022-06-16 DIAGNOSIS — M79604 Pain in right leg: Secondary | ICD-10-CM | POA: Diagnosis not present

## 2022-06-16 DIAGNOSIS — G43909 Migraine, unspecified, not intractable, without status migrainosus: Secondary | ICD-10-CM | POA: Diagnosis not present

## 2022-06-16 DIAGNOSIS — R42 Dizziness and giddiness: Secondary | ICD-10-CM | POA: Diagnosis not present

## 2022-06-16 DIAGNOSIS — M79605 Pain in left leg: Secondary | ICD-10-CM | POA: Diagnosis not present

## 2022-06-16 DIAGNOSIS — K5901 Slow transit constipation: Secondary | ICD-10-CM | POA: Diagnosis not present

## 2022-07-08 DIAGNOSIS — R69 Illness, unspecified: Secondary | ICD-10-CM | POA: Diagnosis not present

## 2022-07-08 DIAGNOSIS — Z23 Encounter for immunization: Secondary | ICD-10-CM | POA: Diagnosis not present

## 2022-07-08 DIAGNOSIS — Z87891 Personal history of nicotine dependence: Secondary | ICD-10-CM | POA: Diagnosis not present

## 2022-07-08 DIAGNOSIS — R972 Elevated prostate specific antigen [PSA]: Secondary | ICD-10-CM | POA: Diagnosis not present

## 2022-07-08 DIAGNOSIS — Z0000 Encounter for general adult medical examination without abnormal findings: Secondary | ICD-10-CM | POA: Diagnosis not present

## 2022-07-08 DIAGNOSIS — L299 Pruritus, unspecified: Secondary | ICD-10-CM | POA: Diagnosis not present

## 2022-07-08 DIAGNOSIS — G43909 Migraine, unspecified, not intractable, without status migrainosus: Secondary | ICD-10-CM | POA: Diagnosis not present

## 2022-07-08 DIAGNOSIS — G4700 Insomnia, unspecified: Secondary | ICD-10-CM | POA: Diagnosis not present

## 2022-07-08 DIAGNOSIS — E785 Hyperlipidemia, unspecified: Secondary | ICD-10-CM | POA: Diagnosis not present

## 2022-07-08 DIAGNOSIS — E119 Type 2 diabetes mellitus without complications: Secondary | ICD-10-CM | POA: Diagnosis not present

## 2022-08-09 ENCOUNTER — Other Ambulatory Visit: Payer: Self-pay | Admitting: Family Medicine

## 2022-08-09 DIAGNOSIS — F332 Major depressive disorder, recurrent severe without psychotic features: Secondary | ICD-10-CM

## 2022-08-09 DIAGNOSIS — F341 Dysthymic disorder: Secondary | ICD-10-CM

## 2022-09-21 ENCOUNTER — Telehealth: Payer: Self-pay | Admitting: Licensed Clinical Social Worker

## 2022-09-21 NOTE — Patient Outreach (Signed)
  Care Coordination   Initial Visit Note   09/21/2022 Name: Judy Goodenow MRN: [id number] DOB: [birth year]  Yaser Harvill is a 71 y.o. year old male who sees No primary care provider on file. for primary care. I spoke with  [name] by phone today.  What matters to the patients health and wellness today?  Care Coordination    Goals Addressed             This Visit's Progress    COMPLETED: Care Coordination Activities-LCSW Support       Care Coordination Interventions: Active listening / Reflection utilized  Emotional Support Provided LCSW informed pt of care coordination services. He is interested in [facility] and LCSW f/up. LCSW Stepanie Graver sent message to assigned Care Coordination team         SDOH assessments and interventions completed:  No     Care Coordination Interventions Activated:  Yes  Care Coordination Interventions:  Yes, provided   Follow up plan:  LCSW referred pt to his assigned Care Coordination team to schedule initial appts    Encounter Outcome:  Pt. Visit Completed   Christa See, MSW, [email or web address] Phone [phone] 4:49 PM

## 2022-09-21 NOTE — Patient Instructions (Signed)
Visit Information  Thank you for taking time to visit with me today. Please don't hesitate to contact me if I can be of assistance to you.   Following are the goals we discussed today:   Goals Addressed             This Visit's Progress    COMPLETED: Care Coordination Activities-LCSW Support       Care Coordination Interventions: Active listening / Reflection utilized  Emotional Support Provided LCSW informed pt of care coordination services. He is interested in [facility] and LCSW f/up. LCSW Lailie Smead sent message to assigned Care Coordination team         If you are experiencing a Mental Health or [place] or need someone to talk to, please call the Suicide and Crisis Lifeline: 988 call 911   Patient verbalizes understanding of instructions and care plan provided today and agrees to view in [place]. Active MyChart status and patient understanding of how to access instructions and care plan via MyChart confirmed with patient.     Christa See, MSW, [email or web address] Phone [phone] 4:50 PM

## 2022-09-24 ENCOUNTER — Telehealth: Payer: Self-pay | Admitting: *Deleted

## 2022-09-24 NOTE — Chronic Care Management (AMB) (Signed)
  Care Coordination   Note   09/24/2022 Name: Dakota Gray MRN: [id number] DOB: 1951/06/09  Dakota Gray is a 71 y.o. year old male who sees No primary care provider on file. for primary care. I reached out to Dakota Gray by phone today to offer care coordination services.  Dakota Gray was given information about Care Coordination services today including:   The Care Coordination services include support from the care team which includes your Nurse Coordinator, Clinical Social Worker, or Pharmacist.  The Care Coordination team is here to help remove barriers to the health concerns and goals most important to you. Care Coordination services are voluntary, and the patient may decline or stop services at any time by request to their care team member.   Care Coordination Consent Status: Patient agreed to services and verbal consent obtained.   Follow up plan:  Telephone appointment with care coordination team member scheduled for:  09/27/2022  Encounter Outcome:  Pt. Scheduled  Julian Hy, [place] Direct Dial: [phone]

## 2022-09-27 ENCOUNTER — Ambulatory Visit: Payer: Self-pay | Admitting: Licensed Clinical Social Worker

## 2022-09-27 NOTE — Patient Outreach (Signed)
  Care Coordination   Initial Visit Note   09/27/2022 Name: Dakota Gray MRN: [id number] DOB: 09/25/1951  Dakota Gray is a 71 y.o. year old male who sees No primary care provider on file. for primary care. I spoke with  Dakota Gray by phone today.  What matters to the patients health and wellness today? Patient wants to live independently and wants to receive medical care as needed.     Goals Addressed               This Visit's Progress     patient wants to live independently and receive medical care as needed (pt-stated)        Care Interventions:  LCSW informed client of Care Coordination program support Discussed client support with PCP, Dr. Okey Gray, [place] at [facility] Discussed past medical care of client . Discussed financial needs of client. He does receive Food Stamps benefit monthly which is helpful to client Discussed transport needs of client. He has an older car and only travels short distances and makes short trips. Discussed food needs of client. Discussed medication needs. He has [employer] and Medicaid. He said his mediations are affordable. Discussed sleeping challenges.  Provided counseling support for client Provided client with LCSW contact number of [phone]. Discussed client concerns related to possible rent increase at his residence Discussed Care Coordination program support with RN, LCSW and Pharmacist. Client said at this time he would just like to talk periodically with LCSW for support     SDOH assessments and interventions completed:  Yes  SDOH Interventions Today    Flowsheet Row Most Recent Value  SDOH Interventions   Depression Interventions/Treatment  Counseling, Medication  Social Connections Interventions Other (Comment)  [patient has reduced family support]        Care Coordination Interventions Activated:  Yes  Care Coordination Interventions:  Yes, provided   Follow up plan: Follow up call  scheduled for 11/02/22 at 1:00 PM    Encounter Outcome:  Pt. Visit Completed

## 2022-09-27 NOTE — Patient Instructions (Signed)
Visit Information  Thank you for taking time to visit with me today. Please don't hesitate to contact me if I can be of assistance to you before our next scheduled telephone appointment.  Following are the goals we discussed today:   Our next appointment is by telephone on 11/02/22 at 1:00 PM  Please call the care guide team at [phone] if you need to cancel or reschedule your appointment.   If you are experiencing a Mental Health or [place] or need someone to talk to, please go to [facility] [place] [phone])   Following is a copy of your full plan of care:   Care Interventions:  LCSW informed client of Care Coordination program support Discussed client support with PCP, Dr. Okey Dupre, [place] at [facility] Discussed past medical care of client . Discussed financial needs of client. He does receive Food Stamps benefit monthly which is helpful to client Discussed transport needs of client. He has an older car and only travels short distances and makes short trips. Discussed food needs of client. Discussed medication needs. He has [employer] and Medicaid. He said his mediations are affordable. Discussed sleeping challenges.  Provided counseling support for client Provided client with LCSW contact number of [phone]. Discussed client concerns related to possible rent increase at his residence Discussed Care Coordination program support with RN, LCSW and Pharmacist. Client said at this time he would just like to talk periodically with LCSW for support  Mr. Robicheaux was given information about Care Management services by the embedded care coordination team including:  Care Management services include personalized support from designated clinical staff supervised by his physician, including individualized plan of care and coordination with other care providers 24/7 contact phone numbers  for assistance for urgent and routine care needs. The patient may stop CCM services at any time (effective at the end of the month) by phone call to the office staff.  Patient agreed to services and verbal consent obtained.   [name].Lashaunda Schild MSW, [place] Holiday representative [facility] Care Management [phone]

## 2022-10-13 ENCOUNTER — Ambulatory Visit: Payer: Self-pay | Admitting: Licensed Clinical Social Worker

## 2022-10-13 NOTE — Patient Outreach (Signed)
  Care Coordination   Follow Up Visit Note   10/13/2022 Name: Christ Fullenwider MRN: [id number] DOB: Apr 23, 1951  Kenith Trickel is a 71 y.o. year old male who sees No primary care provider on file. for primary care. I spoke with  [name] by phone today.  What matters to the patients health and wellness today? Client wants to live independently and receive medical care as needed     Goals Addressed               This Visit's Progress     patient wants to live independently and receive medical care as needed (pt-stated)        Care Interventions:  Discussed client concerns regarding issues at apartment complex related to plumbing needs, landlord issues, rent issues. Discussed patient concerns related to patient privacy.  Client asked for contact information from LCSW regarding APS contact number and phone number for [employer]. LCSW sent secure email to client giving him name of [place], phone number and address of [place] DSS and giving him phone number for APS at [place]. LCSW also provided client with name, address and phone number of [employer] Discussed food needs of client. Discussed medication needs. He has [employer] and Medicaid. He said his medications are affordable. Provided counseling support for client Discussed client concerns related to possible rent increase at his residence    SDOH assessments and interventions completed:  Yes  SDOH Interventions Today    Flowsheet Row Most Recent Value  SDOH Interventions   Depression Interventions/Treatment  Counseling, Medication  Social Connections Interventions Other (Comment)        Care Coordination Interventions Activated:  Yes  Care Coordination Interventions:  Yes, provided   Follow up plan: Follow up call scheduled for 11/02/22 at 1:00 PM     Encounter Outcome:  Pt. Visit Completed

## 2022-10-13 NOTE — Patient Instructions (Signed)
Visit Information  Thank you for taking time to visit with me today. Please don't hesitate to contact me if I can be of assistance to you before our next scheduled telephone appointment.  Following are the goals we discussed today:   Our next appointment is by telephone on 11/02/22 at 1:00 PM   Please call the care guide team at [phone] if you need to cancel or reschedule your appointment.   If you are experiencing a Mental Health or [place] or need someone to talk to, please go to [facility] [place] [phone])   Following is a copy of your full plan of care:   Care Interventions:  Discussed client concerns regarding issues at apartment complex related to plumbing needs, landlord issues, rent issues. Discussed patient concerns related to patient privacy.  Client asked for contact information from LCSW regarding APS contact number and phone number for [employer]. LCSW sent secure email to client giving him name of Dakota Gray, phone number and address of [place] DSS and giving him phone number for APS at [place]. LCSW also provided client with name, address and phone number of [employer] Discussed food needs of client. Discussed medication needs. He has [employer] and Medicaid. He said his medications are affordable. Provided counseling support for client Discussed client concerns related to possible rent increase at his residence  Dakota Gray was given information about Care Management services by the embedded care coordination team including:  Care Management services include personalized support from designated clinical staff supervised by his physician, including individualized plan of care and coordination with other care providers 24/7 contact phone numbers for assistance for urgent and routine care needs. The patient may stop CCM services  at any time (effective at the end of the month) by phone call to the office staff.  Patient agreed to services and verbal consent obtained.   Dakota Gray.Dakota Gray MSW, [place] Holiday representative [facility] Care Management [phone]

## 2022-11-02 ENCOUNTER — Ambulatory Visit: Payer: Self-pay | Admitting: Licensed Clinical Social Worker

## 2022-11-02 NOTE — Patient Instructions (Signed)
Visit Information  Thank you for taking time to visit with me today. Please don't hesitate to contact me if I can be of assistance to you before our next scheduled telephone appointment.  Following are the goals we discussed today:   Our next appointment is by telephone on 11/24/22 at 10:30 AM   Please call the care guide team at [phone] if you need to cancel or reschedule your appointment.   If you are experiencing a Mental Health or [place] or need someone to talk to, please go to [facility] [place] [phone])   Following is a copy of your full plan of care:   Care Interventions:  Discussed client concerns regarding issues at apartment complex related to plumbing needs, landlord issues, and community environment. Discussed patient concerns related to patient privacy.  Discussed food needs of client. Discussed medication needs. He has [employer] and Medicaid. He said his medications are affordable. He also said he receives monthly Food stamps benefit which helps with food purchases. Provided counseling support for client Discussed hobbies, recreation activities of client. Client enjoys listening to music, watching movies, he walks occasionally for exercise.   Discussed financial needs of client.   Discussed transport needs. He has a truck which he drives short distances. He said he does not drive his truck for long distances. Reviewed sleeping challenges. He said he takes medications as prescribed but has difficult sleeping  Dakota Gray was given information about Care Management services by the embedded care coordination team including:  Care Management services include personalized support from designated clinical staff supervised by his physician, including individualized plan of care and coordination with other care providers 24/7 contact phone numbers for assistance for urgent and routine care  needs. The patient may stop CCM services at any time (effective at the end of the month) by phone call to the office staff.  Patient agreed to services and verbal consent obtained.   Dakota Gray.Dakota Gray MSW, [place] Holiday representative [facility] Care Management [phone]

## 2022-11-02 NOTE — Patient Outreach (Signed)
  Care Coordination   Follow Up Visit Note   11/02/2022 Name: Dakota Gray MRN: [id number] DOB: Apr 22, 1951  Dakota Gray is a 71 y.o. year old male who sees No primary care provider on file. for primary care. I spoke with  Dakota Gray by phone today.  What matters to the patients health and wellness today? Client wants to live independently and receive medical care as needed.    Goals Addressed               This Visit's Progress     patient wants to live independently and receive medical care as needed (pt-stated)        Care Interventions:  Discussed client concerns regarding issues at apartment complex related to plumbing needs, landlord issues, and community environment. Discussed patient concerns related to patient privacy.  Discussed food needs of client. Discussed medication needs. He has [employer] and Medicaid. He said his medications are affordable. He also said he receives monthly Food stamps benefit which helps with food purchases. Provided counseling support for client Discussed hobbies, recreation activities of client. Client enjoys listening to music, watching movies, he walks occasionally for exercise.   Discussed financial needs of client.   Discussed transport needs. He has a truck which he drives short distances. He said he does not drive his truck for long distances. Reviewed sleeping challenges. He said he takes medications as prescribed but has difficult sleeping     SDOH assessments and interventions completed:  Yes  SDOH Interventions Today    Flowsheet Row Most Recent Value  SDOH Interventions   Depression Interventions/Treatment  Medication, Counseling  Stress Interventions Provide Counseling  [difficulty sleeping ,  stress related to medical needs]        Care Coordination Interventions Activated:  Yes  Care Coordination Interventions:  Yes, provided   Follow up plan: Follow up call scheduled for 11/24/22 at 10:30 AM    Encounter  Outcome:  Pt. Visit Completed   [name].Makih Stefanko MSW, [place] Holiday representative [facility] Care Management [phone]

## 2022-11-24 ENCOUNTER — Ambulatory Visit: Payer: Self-pay | Admitting: Licensed Clinical Social Worker

## 2022-11-24 NOTE — Patient Outreach (Signed)
  Care Coordination   Follow Up Visit Note   11/24/2022 Name: Dakota Gray MRN: [id number] DOB: 07-04-1951  Dakota Gray is a 71 y.o. year old male who sees No primary care provider on file. for primary care. I spoke with  [name] by phone today.  What matters to the patients health and wellness today? patient wants to live independently and receive medical care as needed.     Goals Addressed               This Visit's Progress     patient wants to live independently and receive medical care as needed (pt-stated)        Care Interventions:  Discussed client concerns regarding issues at apartment complex related to plumbing needs, landlord issues, and community environment. Discussed food needs of client. Discussed medication needs. He has [employer] and Medicaid. He said his medications are affordable. He also said he receives monthly Food stamps benefit which helps with food purchases. Provided counseling support for client Discussed financial needs of client.  Discussed rent of client. Client said that his rent rate has been stable for about two years Discussed transport needs. He has a truck which he drives short distances. He said he does not drive his truck for long distances. Reviewed sleeping challenges. He said he has difficulty sleeping Discussed support with PCP.  LCSW and client agreed for LCSW to call client on 12/15/22 at 11:00 AM to further discuss client needs at that time    SDOH assessments and interventions completed:  Yes     Care Coordination Interventions:  Yes, provided   Follow up plan: Follow up call scheduled for 12/15/22 at 11:00 AM    Encounter Outcome:  Pt. Visit Completed   [name].Jasara Corrigan MSW, LCSW Licensed Visual merchandiser [facility] Care Management [phone]

## 2022-11-24 NOTE — Patient Instructions (Signed)
Visit Information  Thank you for taking time to visit with me today. Please don't hesitate to contact me if I can be of assistance to you before our next scheduled telephone appointment.  Following are the goals we discussed today:   Our next appointment is by telephone on 12/15/22 at 11:00 AM   Please call the care guide team at [phone] if you need to cancel or reschedule your appointment.   If you are experiencing a Mental Health or Behavioral Health Crisis or need someone to talk to, please go to [facility] [address], [place] [phone])   Following is a copy of your full plan of care:   Care Interventions:  Discussed client concerns regarding issues at apartment complex related to plumbing needs, landlord issues, and community environment. Discussed food needs of client. Discussed medication needs. He has [employer] and Medicaid. He said his medications are affordable. He also said he receives monthly Food stamps benefit which helps with food purchases. Provided counseling support for client Discussed financial needs of client.  Discussed rent of client. Client said that his rent rate has been stable for about two years Discussed transport needs. He has a truck which he drives short distances. He said he does not drive his truck for long distances. Reviewed sleeping challenges. He said he has difficulty sleeping Discussed support with PCP.  LCSW and client agreed for LCSW to call client on 12/15/22 at 11:00 AM to further discuss client needs at that time  Mr. Maloof was given information about Care Management services by the embedded care coordination team including:  Care Management services include personalized support from designated clinical staff supervised by his physician, including individualized plan of care and coordination with other care providers 24/7 contact phone numbers for assistance for urgent and routine care  needs. The patient may stop CCM services at any time (effective at the end of the month) by phone call to the office staff.  Patient agreed to services and verbal consent obtained.   [name].Diego Ulbricht MSW, LCSW Licensed Visual merchandiser [facility] Care Management [phone]

## 2022-12-14 ENCOUNTER — Ambulatory Visit: Payer: Self-pay | Admitting: Licensed Clinical Social Worker

## 2022-12-14 DIAGNOSIS — G479 Sleep disorder, unspecified: Secondary | ICD-10-CM

## 2022-12-15 ENCOUNTER — Ambulatory Visit: Payer: Self-pay | Admitting: Licensed Clinical Social Worker

## 2022-12-15 ENCOUNTER — Telehealth: Payer: Self-pay

## 2022-12-15 NOTE — Patient Instructions (Addendum)
Visit Information  Thank you for taking time to visit with me today. Please don't hesitate to contact me if I can be of assistance to you before our next scheduled telephone appointment.  Following are the goals we discussed today:   Our next appointment is by telephone on 01/12/23 at 11:00 AM   Please call the care guide team at [phone] if you need to cancel or reschedule your appointment.   If you are experiencing a Mental Health or Behavioral Health Crisis or need someone to talk to, please go to [facility] [address] [place] [phone])   Following is a copy of your full plan of care:   Care Interventions:  Discussed client concerns regarding issues at apartment complex related to plumbing needs, landlord issues, and community environment. Discussed food needs of client. Discussed medication needs. He has [employer] and Medicaid. He said his medications are affordable. He also said he receives monthly Food stamps benefit which helps with food purchases. 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  He has received one letter from [employer] program already Provided counseling support for client Discussed financial needs of client.  Discussed rent of client. Client said that his rent rate is going up in February of 2024.  Discussed transport needs. He has a truck which he drives short distances. He said he does not drive his truck for long distances. Reviewed sleeping challenges. He said he has difficulty sleeping Discussed with Kaylee that LCSW had put in referral to Care Guide to please mail client resources list related to food needs, transport needs, utility help, rent assistance Discussed heating needs of client. He said he is using a small heater in his apartment.  Discussed LIEAP program assistance. Client said LIEAP program  had sent [employer] a check to help out on his utility costs. This assistance from LIEAP program was very helpful to client Client and LCSW agreed for LCSW to call client on January 12, 2023 at 11:00 AM   Mr. Dakota Gray was given information about Care Management services by the embedded care coordination team including:  Care Management services include personalized support from designated clinical staff supervised by his physician, including individualized plan of care and coordination with other care providers 24/7 contact phone numbers for assistance for urgent and routine care needs. The patient may stop CCM services at any time (effective at the end of the month) by phone call to the office staff.  Patient agreed to services and verbal consent obtained.   Dakota Gray.Dakota Gray MSW, LCSW Licensed Visual merchandiser [facility] Care Management [phone]

## 2022-12-15 NOTE — Patient Outreach (Signed)
  Care Coordination   Follow Up Visit Note   12/15/2022 Name: Dakota Gray MRN: [id number] DOB: August 22, 1951  Dakota Gray is a 71 y.o. year old male who sees No primary care provider on file. for primary care. I spoke with  Dakota Gray by phone today.  What matters to the patients health and wellness today? Patient wants to live independently and receive medical care as needed.     Goals Addressed               This Visit's Progress     Patient Stated        patient wants to live independently and receive medical care as needed (pt-stated)        Care Interventions:  Discussed client concerns regarding issues at apartment complex related to plumbing needs, landlord issues, and community environment. Discussed food needs of client. Discussed medication needs. He has [employer] and Medicaid. He said his medications are affordable. He also said he receives monthly Food stamps benefit which helps with food purchases. 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  He has received one letter from [employer] program already Provided counseling support for client Discussed financial needs of client.  Discussed rent of client. Client said that his rent rate is going up in February of 2024.  Discussed transport needs. He has a truck which he drives short distances. He said he does not drive his truck for long distances. Reviewed sleeping challenges. He said he has difficulty sleeping Discussed with Dakota Gray that LCSW had put in referral to Care Guide to please mail client resources list related to food needs, transport needs, utility help, rent assistance Discussed heating needs of client. He said he is using a small heater in his apartment.  Discussed LIEAP program assistance. Client said LIEAP program had sent [employer] a check to help out on his utility costs. This assistance from LIEAP  program was very helpful to client Client and LCSW agreed for LCSW to call client on January 12, 2023 at 11:00 AM        SDOH assessments and interventions completed:  Yes  SDOH Interventions Today    Flowsheet Row Most Recent Value  SDOH Interventions   Depression Interventions/Treatment  Counseling  Stress Interventions Provide Counseling  [client has stress related to managing daily care needs]        Care Coordination Interventions:  Yes, provided   Follow up plan: Follow up call scheduled for 01/12/23 at 11:00 AM     Encounter Outcome:  Pt. Visit Completed   Dakota Gray.Dakota Gray MSW, LCSW Licensed Visual merchandiser [facility] Care Management [phone]

## 2022-12-15 NOTE — Telephone Encounter (Signed)
   Telephone encounter was:  Unsuccessful.  12/15/2022 Name: Dakota Gray MRN: [id number] DOB: Dec 01, 1951  Unsuccessful outbound call made today to assist with:  Food Insecurity and Financial Difficulties related to Financial Strain  Outreach Attempt:  1st Attempt  A HIPAA compliant voice message was left requesting a return call.  Instructed patient to call back   [name] [facility] Guide, [facility], Care Management  [phone] [address] [place], [place], [place] [address] Phone: [phone] Email: [email or web address]

## 2022-12-16 ENCOUNTER — Telehealth: Payer: Self-pay

## 2022-12-16 NOTE — Telephone Encounter (Signed)
   Telephone encounter was:  Unsuccessful.  12/16/2022 Name: Dakota Gray MRN: [id number] DOB: 1951-05-12  Unsuccessful outbound call made today to assist with:   Financial strain  Outreach Attempt:  2nd Attempt Unable to Leave Message    [name] [facility] Guide, [facility], Care Management  [phone] [address] [place], [place], [place] [address] Phone: [phone] Email: [email or web address]

## 2022-12-17 ENCOUNTER — Telehealth: Payer: Self-pay

## 2022-12-17 NOTE — Telephone Encounter (Signed)
   Telephone encounter was:  Unsuccessful.  12/17/2022 Name: Alwyn Cordner MRN: [id number] DOB: [birth year]  Unsuccessful outbound call made today to assist with:  Food Insecurity and Financial Difficulties related to Financial Strain  Outreach Attempt:  3rd Attempt.  Referral closed unable to contact patient.  Unable to Leave Message    [name] [facility] Guide, [facility], Care Management  [phone] [address] [place], [place], [place] [address] Phone: [phone] Email: [email or web address]

## 2023-01-07 DIAGNOSIS — E119 Type 2 diabetes mellitus without complications: Secondary | ICD-10-CM | POA: Diagnosis not present

## 2023-01-07 DIAGNOSIS — G4700 Insomnia, unspecified: Secondary | ICD-10-CM | POA: Diagnosis not present

## 2023-01-07 DIAGNOSIS — R972 Elevated prostate specific antigen [PSA]: Secondary | ICD-10-CM | POA: Diagnosis not present

## 2023-01-07 DIAGNOSIS — E785 Hyperlipidemia, unspecified: Secondary | ICD-10-CM | POA: Diagnosis not present

## 2023-01-07 DIAGNOSIS — R69 Illness, unspecified: Secondary | ICD-10-CM | POA: Diagnosis not present

## 2023-01-12 ENCOUNTER — Ambulatory Visit: Payer: Self-pay | Admitting: Licensed Clinical Social Worker

## 2023-01-12 ENCOUNTER — Telehealth: Payer: Self-pay

## 2023-01-12 DIAGNOSIS — E1110 Type 2 diabetes mellitus with ketoacidosis without coma: Secondary | ICD-10-CM

## 2023-01-12 DIAGNOSIS — F341 Dysthymic disorder: Secondary | ICD-10-CM

## 2023-01-12 NOTE — Telephone Encounter (Signed)
   Telephone encounter was:  Successful.  01/12/2023 Name: Dakota Gray MRN: [id number] DOB: [birth year]  Dakota Gray is a 72 y.o. year old male who is a primary care patient of No primary care provider on file. . The community resource team was consulted for assistance with Transportation Needs , Food Insecurity, and utilities, rent.  Care guide performed the following interventions: Spoke with patient confirmed home mailing address. [employer].  Follow Up Plan:  No further follow up planned at this time. The patient has been provided with needed resources.  [place] Resource Care Guide   [email or web address]  ?? [phone]   Website: [email or web address]  [email or web address]

## 2023-01-12 NOTE — Patient Outreach (Signed)
  Care Coordination   Follow Up Visit Note   01/12/2023 Name: Dakota Gray MRN: [id number] DOB: 09-08-1951  Dakota Gray is a 72 y.o. year old male who sees No primary care provider on file. for primary care. I spoke with  Dakota Gray by phone today.  What matters to the patients health and wellness today? Client wants to live independently and receive medical care as needed.    Goals Addressed               This Visit's Progress     patient wants to live independently and receive medical care as needed (pt-stated)        Care Interventions:  LCSW talked with client today about client needs Client discussed that he gets Food Stamps benefit monthly and does his own cooking Discussed issues at apartment complex. Discussed water bill needs and rent increase recently.  Provided counseling support for client Discussed financial needs of client.  Discussed rent of client. Client said that his rent rate is going up in February of 2024.  Discussed transport needs. He has a truck which he drives short distances. He said he does not drive his truck for long distances. Reviewed sleeping challenges. He said he has difficulty sleeping Discussed with Dakota Gray that LCSW had put in referral to Care Guide to please mail client resources list related to food needs, transport needs, utility help, rent assistance Discussed health needs of client . He said recently that his PSA level was elevated. He sees his PCP for medical care. He said it was recommended that he see medical provider at [facility] in [place], [place] and LCSW agreed for LCSW to call client on February 08, 2023 at 11:00 AM       SDOH assessments and interventions completed:  Yes  SDOH Interventions Today    Flowsheet Row Most Recent Value  SDOH Interventions   Depression Interventions/Treatment  Counseling  Stress Interventions Provide Counseling  [client has stress over managing medical needs]        Care  Coordination Interventions:  Yes, provided   Follow up plan: Follow up call scheduled for 02/08/23 at 11:00 AM    Encounter Outcome:  Pt. Visit Completed   [name].Yasha Tibbett MSW, [place] Holiday representative [facility] Care Management [phone]

## 2023-01-12 NOTE — Patient Instructions (Signed)
Visit Information  Thank you for taking time to visit with me today. Please don't hesitate to contact me if I can be of assistance to you before our next scheduled telephone appointment.  Following are the goals we discussed today:   Our next appointment is by telephone on 02/08/23 at 11:00 AM   Please call the care guide team at [phone] if you need to cancel or reschedule your appointment.   If you are experiencing a Mental Health or [place] or need someone to talk to, please go to [facility] [place] [phone])   Following is a copy of your full plan of care:   Care Interventions:  LCSW talked with client today about client needs Client discussed that he gets Food Stamps benefit monthly and does his own cooking Discussed issues at apartment complex. Discussed water bill needs and rent increase recently.  Provided counseling support for client Discussed financial needs of client.  Discussed rent of client. Client said that his rent rate is going up in February of 2024.  Discussed transport needs. He has a truck which he drives short distances. He said he does not drive his truck for long distances. Reviewed sleeping challenges. He said he has difficulty sleeping Discussed with Nyzir that LCSW had put in referral to Care Guide to please mail client resources list related to food needs, transport needs, utility help, rent assistance Discussed health needs of client . He said recently that his PSA level was elevated. He sees his PCP for medical care. He said it was recommended that he see medical provider at [facility] in [place], [place] and LCSW agreed for LCSW to call client on February 08, 2023 at 11:00 AM   Mr. Reader was given information about Care Management services by the embedded care coordination team including:  Care Management services include personalized support from designated  clinical staff supervised by his physician, including individualized plan of care and coordination with other care providers 24/7 contact phone numbers for assistance for urgent and routine care needs. The patient may stop CCM services at any time (effective at the end of the month) by phone call to the office staff.  Patient agreed to services and verbal consent obtained.   [name].Reata Petrov MSW, [place] Holiday representative [facility] Care Management [phone]

## 2023-02-08 ENCOUNTER — Ambulatory Visit: Payer: Self-pay | Admitting: Licensed Clinical Social Worker

## 2023-02-08 NOTE — Patient Outreach (Signed)
  Care Coordination   Follow Up Visit Note   02/08/2023 Name: Dakota Gray MRN: [id number] DOB: [birth year]  Dakota Gray is a 72 y.o. year old male who sees No primary care provider on file. for primary care. I spoke with  [name] by phone today.  What matters to the patients health and wellness today? Patient wants to live independently and receive medical care as needed.     Goals Addressed               This Visit's Progress     patient wants to live independently and receive medical care as needed (pt-stated)        Care Interventions:  LCSW talked with client today about client needs Client discussed that he gets Food Stamps benefit monthly and does his own cooking. LCSW talked with Rayvion about food banks in area and his contacting food banks for possible food help Discussed issues at apartment complex. Discussed water bill needs and rent increase recently. Client said his rent at apartment went up $ 75.00 per month Provided counseling support for client Discussed financial needs of client. Discussed that client could use community resources list CMA mailed him to inquire about possible agency assistance for client.  Discussed transport needs. He has a truck which he drives short distances. He said he does not drive his truck for long distances. LCSW talked today about client possibly using Medicaid benefit for transport support. Suggested he call local transport agencies to inquire about using his Medicaid benefit to possibly help with transport costs for  client  Discussed health needs of client . He said recently that his PSA level was elevated. He sees his PCP for medical care. He has been referred to [facility] for possible medical help from that practice Discussed social support for client; discussed client issues with landlord and communication with landlord Client agreed for LCSW to call client on March 15, 2023 at 11:00 AM       SDOH assessments and  interventions completed:  Yes  SDOH Interventions Today    Flowsheet Row Most Recent Value  SDOH Interventions   Depression Interventions/Treatment  Counseling  Financial Strain Interventions Other (Comment)  [financial strain related to rent,utilities]  Stress Interventions Provide Counseling  [stress related to financial needs]        Care Coordination Interventions:  Yes, provided   Interventions Today    Flowsheet Row Most Recent Value  Chronic Disease   Chronic disease during today's visit --  [anxiety]  General Interventions   General Interventions Discussed/Reviewed [employer]  [discussed  community resources list mailed to client]  Exercise Interventions   Exercise Discussed/Reviewed Physical Activity  [discussed walking of client]  Education Interventions   Education Provided Provided Education  Provided Orthoptist [place] Discussed/Reviewed Anxiety, Coping Strategies  Safety Interventions   Safety Discussed/Reviewed Home Safety  [discussed home environment]        Follow up plan: Follow up call scheduled for 03/15/23 at 11:00 AM    Encounter Outcome:  Pt. Visit Completed   [name].Makaela Cando MSW, [place] Holiday representative [facility] Care Management [phone]

## 2023-02-08 NOTE — Patient Instructions (Signed)
   Visit Information  Thank you for taking time to visit with me today. Please don't hesitate to contact me if I can be of assistance to you before our next scheduled telephone appointment.  Following are the goals we discussed today:    Our next appointment is by telephone on 03/15/23 at 11:00 AM   Please call the care guide team at [phone] if you need to cancel or reschedule your appointment.   If you are experiencing a Mental Health or [place] or need someone to talk to, please go to [facility] [place] [phone])   Following is a copy of your full plan of care:   Care Interventions:  LCSW talked with client today about client needs Client discussed that he gets Food Stamps benefit monthly and does his own cooking. LCSW talked with [place] about food banks in area and his contacting food banks for possible food help Discussed issues at apartment complex. Discussed water bill needs and rent increase recently. Client said his rent at apartment went up $ 75.00 per month Provided counseling support for client Discussed financial needs of client. Discussed that client could use community resources list CMA mailed him to inquire about possible agency assistance for client.  Discussed transport needs. He has a truck which he drives short distances. He said he does not drive his truck for long distances. LCSW talked today about client possibly using Medicaid benefit for transport support. Suggested he call local transport agencies to inquire about using his Medicaid benefit to possibly help with transport costs for  client  Discussed health needs of client . He said recently that his PSA level was elevated. He sees his PCP for medical care. He has been referred to [facility] for possible medical help from that practice Discussed social support for client; discussed client issues with landlord and communication  with landlord Client agreed for LCSW to call client on March 15, 2023 at 11:00 AM  Mr. Dakota Gray was given information about Care Management services by the embedded care coordination team including:  Care Management services include personalized support from designated clinical staff supervised by his physician, including individualized plan of care and coordination with other care providers 24/7 contact phone numbers for assistance for urgent and routine care needs. The patient may stop CCM services at any time (effective at the end of the month) by phone call to the office staff.  Patient agreed to services and verbal consent obtained.   [name].Dakota Gray MSW, [place] Holiday representative [facility] Care Management [phone]

## 2023-03-15 ENCOUNTER — Ambulatory Visit: Payer: Self-pay | Admitting: Licensed Clinical Social Worker

## 2023-03-15 NOTE — Patient Outreach (Signed)
  Care Coordination   Follow Up Visit Note   03/15/2023 Name: Dakota Gray MRN: [id number] DOB: 1951-10-23  Dakota Gray is a 72 y.o. year old male who sees Dr. Walker Shadow. Henderson for primary care. I spoke with  Dakota Gray by phone today.  What matters to the patients health and wellness today?  Patient wants to live independently and receive Medical care as needed     Goals Addressed               This Visit's Progress     patient wants to live independently and receive medical care as needed (pt-stated)        Care Interventions:  LCSW talked via phone with Dakota Gray today about client status Client said he had forgotten about appointment today via phone with LCSW. Client asked if phone call with LCSW and client could be rescheduled.  LCSW agreed to plan. LCSW to call client on 03/16/23 at 9:30 AM.  Client agreed to this plan.     SDOH assessments and interventions completed:  Yes  SDOH Interventions Today    Flowsheet Row Most Recent Value  SDOH Interventions   Depression Interventions/Treatment  Medication, Counseling  Stress Interventions Provide Counseling  [client has stress related to managing medical needs]        Care Coordination Interventions:  Yes, provided   Interventions Today    Flowsheet Row Most Recent Value  Chronic Disease   Chronic disease during today's visit Other  [discussed client status]  General Interventions   General Interventions Discussed/Reviewed General Interventions Discussed  [program support has been discussed]  Education Interventions   Education Provided Provided Education  Provided Verbal Education On [employer]  Mental Health Interventions   Mental Health Discussed/Reviewed Anxiety, Coping Strategies  [coping skills have been discussed]        Follow up plan: Follow up call scheduled for 03/16/23 at 9:30 AM    Encounter Outcome:  Pt. Visit Completed   [name].Curstin Schmale MSW, [place] Artist [facility] Care Management [phone]

## 2023-03-15 NOTE — Patient Instructions (Signed)
Visit Information  Thank you for taking time to visit with me today. Please don't hesitate to contact me if I can be of assistance to you.   Following are the goals we discussed today:   Goals Addressed               This Visit's Progress     patient wants to live independently and receive medical care as needed (pt-stated)        Care Interventions:  LCSW talked via phone with [name] today about client status Client said he had forgotten about appointment today via phone with LCSW. Client asked if phone call with LCSW and client could be rescheduled.  LCSW agreed to plan. LCSW to call client on 03/16/23 at 9:30 AM.  Client agreed to this plan.     Our next appointment is by telephone on 03/16/23 at 9:30 AM   Please call the care guide team at [phone] if you need to cancel or reschedule your appointment.   If you are experiencing a Mental Health or [place] or need someone to talk to, please go to [facility] [address] [place] [phone])   Patient verbalizes understanding of instructions and care plan provided today and agrees to view in [place]. Active MyChart status and patient understanding of how to access instructions and care plan via MyChart confirmed with patient.     The patient has been provided with contact information for the care management team and has been advised to call with any health related questions or concerns.   [name].Naheem Mosco MSW, [place] Holiday representative [facility] Care Management [phone]

## 2023-03-16 ENCOUNTER — Ambulatory Visit: Payer: Self-pay | Admitting: Licensed Clinical Social Worker

## 2023-03-16 NOTE — Patient Outreach (Signed)
  Care Coordination   Follow Up Visit Note   03/16/2023 Name: Dakota Gray MRN: [id number] DOB: 16-Jun-1951  Dakota Gray is a 72 y.o. year old male who sees Dr. Okey Dupre, MD for primary care. I spoke with  Dakota Gray by phone today.  What matters to the patients health and wellness today?  Patient wants to live independently and receive  medical care as needed.    Goals Addressed               This Visit's Progress     patient wants to live independently and receive medical care as needed (pt-stated)        Care Interventions:  LCSW talked via phone  today with client, Dakota Gray. LCSW and client spoke of client needs. Client said he has been attending scheduled medical appointments. Discussed client support with PCP, Dakota Gray Discussed medication procurement of client Provided counseling support for client Discussed food procurement with Dakota Gray today . Reviewed transport needs. He drives his car to local appointments and to complete errands. He said his car needs some repair work done Electronics engineer discussed his health concerns. He spoke of elevated PSA. He said he was going to monitor situation at present and did not talk of seeing another specialist at present to evaluate condition.  Client has spoken previously of his concern related to possible cancer diagnosis. He saw his father suffer with cancer. He said he wanted to monitor his situation at present and did not speak of seeing medical provider at present related to his medical condition. He did say he has appointment with PCP in July of 2024. Encouraged Dakota Gray to call LCSW for SW support as needed at [phone]        SDOH assessments and interventions completed:  Yes  SDOH Interventions Today    Flowsheet Row Most Recent Value  SDOH Interventions   Depression Interventions/Treatment  Counseling  Financial Strain Interventions Other (Comment)  [client has financial strain. difficult to  pay for car repairs,  sometimes difficult to pay for monthly bills]  Stress Interventions Provide Counseling  [client has some stress related to living location and living arrangements]        Care Coordination Interventions:  Yes, provided   Interventions Today    Flowsheet Row Most Recent Value  Chronic Disease   Chronic disease during today's visit Other  [spoke with client about client needs]  General Interventions   General Interventions Discussed/Reviewed General Interventions Discussed, Emergency planning/management officer  [discussed community agencies such as Housing Authority]  Education Interventions   Education Provided Provided Education  Provided Orthoptist On [place] Discussed/Reviewed Anxiety, Coping Strategies  [discussed stress management]  Nutrition Interventions   Nutrition Discussed/Reviewed Nutrition Discussed  Safety Interventions   Safety Discussed/Reviewed Home Safety        Follow up plan: Follow up call scheduled for 04/26/23 at 1:00 PM     Encounter Outcome:  Pt. Visit Completed   Dakota Gray.Dakota Gray MSW, [place] Holiday representative [facility] Care Management [phone]

## 2023-03-16 NOTE — Patient Instructions (Signed)
Visit Information  Thank you for taking time to visit with me today. Please don't hesitate to contact me if I can be of assistance to you.   Following are the goals we discussed today:   Goals Addressed               This Visit's Progress     patient wants to live independently and receive medical care as needed (pt-stated)        Care Interventions:  LCSW talked via phone  today with client, Dakota Gray. LCSW and client spoke of client needs. Client said he has been attending scheduled medical appointments. Discussed client support with PCP, Dr. Koleen Nimrod Discussed medication procurement of client Provided counseling support for client Discussed food procurement with Zoren Bird today . Reviewed transport needs. He drives his car to local appointments and to complete errands. He said his car needs some repair work done Electronics engineer discussed his health concerns. He spoke of elevated PSA. He said he was going to monitor situation at present and did not talk of seeing another specialist at present to evaluate condition.  Client has spoken previously of his concern related to possible cancer diagnosis. He saw his father suffer with cancer. He said he wanted to monitor his situation at present and did not speak of seeing medical provider at present related to his medical condition. He did say he has appointment with PCP in July of 2024. Encouraged Dakota Gray to call LCSW for SW support as needed at [phone]        Our next appointment is by telephone on 04/26/23 at 1:00 PM   Please call the care guide team at [phone] if you need to cancel or reschedule your appointment.   If you are experiencing a Mental Health or [place] or need someone to talk to, please go to [facility] [address] [place] [phone])   Patient verbalizes understanding of instructions and care plan provided today and agrees to view in  [place]. Active MyChart status and patient understanding of how to access instructions and care plan via MyChart confirmed with patient.     The patient has been provided with contact information for the care management team and has been advised to call with any health related questions or concerns.   [name].Ijanae Macapagal MSW, [place] Holiday representative [facility] Care Management [phone]

## 2023-03-31 ENCOUNTER — Telehealth: Payer: Self-pay | Admitting: *Deleted

## 2023-03-31 NOTE — Progress Notes (Signed)
  Care Coordination Note  03/31/2023 Name: Marcio Furtado MRN: [id number] DOB: 1951/04/15  Dakota Gray is a 72 y.o. year old male who is a primary care patient of No primary care provider on file. and is actively engaged with the care management team. I reached out to [name] by phone today to assist with re-scheduling a follow up visit with the Licensed Clinical Social Worker  Follow up plan: Unsuccessful telephone outreach attempt made. A HIPAA compliant phone message was left for the patient providing contact information and requesting a return call.   [place]  Direct Dial: [phone]

## 2023-04-04 NOTE — Progress Notes (Unsigned)
  Care Coordination Note  04/04/2023 Name: [name] MRN: [id number] DOB: [birth year]  Dakota Gray is a 72 y.o. year old male who is a primary care patient of No primary care provider on file. and is actively engaged with the care management team. I reached out to [name] by phone today to assist with re-scheduling a follow up visit with the Licensed Clinical Social Worker  Follow up plan: Unsuccessful telephone outreach attempt made. A HIPAA compliant phone message was left for the patient providing contact information and requesting a return call.   [facility]  Care Coordination Care Guide  Direct Dial: [phone]

## 2023-04-05 NOTE — Progress Notes (Signed)
  Care Coordination Note  04/05/2023 Name: Dakota Gray MRN: [id number] DOB: [birth year]  Dakota Gray is a 72 y.o. year old male who is a primary care patient of No primary care provider on file. and is actively engaged with the care management team. I reached out to [name] by phone today to assist with re-scheduling a follow up visit with the Licensed Clinical Social Worker  Follow up plan: Telephone appointment with care management team member scheduled for:04/25/23  [facility]  Care Coordination Care Guide  Direct Dial: [phone]

## 2023-04-25 ENCOUNTER — Ambulatory Visit: Payer: Self-pay | Admitting: Licensed Clinical Social Worker

## 2023-04-25 NOTE — Patient Outreach (Signed)
  Care Coordination   04/25/2023 Name: Dakota Gray MRN: [id number] DOB: 08/21/1951   Care Coordination Outreach Attempts:  An unsuccessful telephone outreach was attempted today to offer the patient information about available care coordination services as a benefit of their health plan.   Follow Up Plan:  Additional outreach attempts will be made to offer the patient care coordination information and services.   Encounter Outcome:  No Answer   Care Coordination Interventions:  No, not indicated    [name].Marylyn Appenzeller MSW, LCSW Licensed Visual merchandiser [facility] Care Management [phone]

## 2023-04-26 ENCOUNTER — Encounter: Payer: Medicare HMO | Admitting: Licensed Clinical Social Worker

## 2023-04-26 ENCOUNTER — Telehealth: Payer: Self-pay | Admitting: *Deleted

## 2023-04-26 NOTE — Progress Notes (Signed)
  Care Coordination Note  04/26/2023 Name: Ezeriah Luty MRN: [id number] DOB: 14-Sep-1951  Skylen Spiering is a 72 y.o. year old male who is a primary care patient of No primary care provider on file. and is actively engaged with the care management team. I reached out to [name] by phone today to assist with re-scheduling a follow up visit with the Licensed Clinical Social Worker  Follow up plan: Unsuccessful telephone outreach attempt made. A HIPAA compliant phone message was left for the patient providing contact information and requesting a return call.   [facility]  Care Coordination Care Guide  Direct Dial: [phone]

## 2023-04-28 NOTE — Progress Notes (Signed)
  Care Coordination Note  04/28/2023 Name: [name] MRN: [id number] DOB: [birth year]  Dakota Gray is a 72 y.o. year old male who is a primary care patient of No primary care provider on file. and is actively engaged with the care management team. I reached out to [name] by phone today to assist with scheduling a follow up visit with the Licensed Clinical Social Worker  Follow up plan: Telephone appointment with care management team member scheduled for:05/02/23  [facility]  Care Coordination Care Guide  Direct Dial: [phone]

## 2023-05-02 ENCOUNTER — Ambulatory Visit: Payer: Self-pay | Admitting: Licensed Clinical Social Worker

## 2023-05-02 NOTE — Patient Outreach (Signed)
  Care Coordination   Follow Up Visit Note   05/02/2023 Name: Dakota Gray MRN: [id number] DOB: 1951-02-02  [name] is a 72 y.o. year old male who sees No primary care provider on file. for primary care. I spoke with  [name] by phone today.  What matters to the patients health and wellness today? Patient spoke of housing environment and  needs in housing environment     Goals Addressed             This Visit's Progress    Patient spoke of housing environment and needs in housing environment       Interventions  Spoke with client about client needs  Discussed housing environment of client. Discussed police support in area Provided counseling support for client Discussed family stress issues Discussed concern over neighbor and needs of neighbor. Neighbor has medical needs and mental health needs Discussed stress issues of client Discussed family gatherings and decreased participation in family gatherings  Provided active listening to client needs Discussed needs of client step mother Discussed support of family to clients step mother Encouraged client to call LCSW as needed for SW support at [phone].  Discussed financial needs of client               SDOH assessments and interventions completed:  Yes  SDOH Interventions Today    Flowsheet Row Most Recent Value  SDOH Interventions   Depression Interventions/Treatment  Counseling  Financial Strain Interventions Other (Comment)  [financial strain]  Stress Interventions Provide Counseling  [stress in managing medical needs]        Care Coordination Interventions:  Yes, provided  ............... Interventions Today    Flowsheet Row Most Recent Value  Chronic Disease   Chronic disease during today's visit Other  [talked with client about client needs]  General Interventions   General Interventions Discussed/Reviewed General Interventions Discussed, [employer]  [discussed program  support]  Education Interventions   Education Provided Provided Education  Provided Engineer, petroleum On [employer]  Mental Health Interventions   Mental Health Discussed/Reviewed Anxiety, Coping Strategies  [discussed stress management]  Pharmacy Interventions   Pharmacy Dicussed/Reviewed Pharmacy Topics Discussed  Safety Interventions   Safety Discussed/Reviewed Home Safety  [discussed housing environment]        Follow up plan: Follow up call scheduled for June 4,2024 at 11:00 AM    Encounter Outcome:  Pt. Visit Completed   [name].Daniele Yankowski MSW, LCSW Licensed Visual merchandiser [facility] Care Management [phone]

## 2023-05-02 NOTE — Patient Instructions (Signed)
Visit Information  Thank you for taking time to visit with me today. Please don't hesitate to contact me if I can be of assistance to you.   Following are the goals we discussed today:   Goals Addressed             This Visit's Progress    Patient spoke of housing environment and needs in housing environment       Interventions  Spoke with client about client needs  Discussed housing environment of client. Discussed police support in area Provided counseling support for client Discussed family stress issues Discussed concern over neighbor and needs of neighbor. Neighbor has medical needs and mental health needs Discussed stress issues of client Discussed family gatherings and decreased participation in family gatherings  Provided active listening to client needs Discussed needs of client step mother Discussed support of family to clients step mother Encouraged client to call LCSW as needed for SW support at [phone].  Discussed financial needs of client               Our next appointment is by telephone on June 4,2024 at 11:00 AM   Please call the care guide team at [phone] if you need to cancel or reschedule your appointment.   If you are experiencing a Mental Health or Behavioral Health Crisis or need someone to talk to, please go to [facility] [address], [place] [phone])   The patient verbalized understanding of instructions, educational materials, and care plan provided today and DECLINED offer to receive copy of patient instructions, educational materials, and care plan.   The patient has been provided with contact information for the care management team and has been advised to call with any health related questions or concerns.   [name].Jakai Risse MSW, LCSW Licensed Visual merchandiser [facility] Care Management [phone]

## 2023-05-31 ENCOUNTER — Ambulatory Visit: Payer: Self-pay | Admitting: Licensed Clinical Social Worker

## 2023-05-31 NOTE — Patient Instructions (Signed)
Visit Information  Thank you for taking time to visit with me today. Please don't hesitate to contact me if I can be of assistance to you.   Following are the goals we discussed today:   Goals Addressed             This Visit's Progress    Patient spoke of relationships with his neighbors. Spoke of housing needs       Interventions:  Spoke with client about client needs Talked with [place] about housing situation . Talked with [place] about relationships with his neighbors. Client and LCSW spoke of client transport needs. He has a truck he uses for short trips Provided counseling support Discussed medication procurement  Used Active Listening techniques to listen to client concerns Discussed recent dental appointment.  Discussed dental history Discussed medical needs Discussed pain issues of client Discussed client upcoming medical physical in July of 2024. Discussed client recent refill of a prescribed medication. He takes medications as prescribed  Discussed HIPAA guidelines for patient privacy Encouraged client to call LCSW as needed at [phone].           Our next appointment is by telephone on 07/26/23 at 11:00 AM   Please call the care guide team at [phone] if you need to cancel or reschedule your appointment.   If you are experiencing a Mental Health or Behavioral Health Crisis or need someone to talk to, please go to [facility] [address], [place] [phone])   The patient verbalized understanding of instructions, educational materials, and care plan provided today and DECLINED offer to receive copy of patient instructions, educational materials, and care plan.   The patient has been provided with contact information for the care management team and has been advised to call with any health related questions or concerns.   [name].Varetta Chavers MSW, LCSW Licensed Visual merchandiser [facility] Care  Management [phone]

## 2023-05-31 NOTE — Patient Outreach (Signed)
  Care Coordination   Follow Up Visit Note   05/31/2023 Name: Awan Slavich MRN: [id number] DOB: 1951-11-06  Finnegan Carkhuff is a 72 y.o. year old male who sees No primary care provider on file. for primary care. I spoke with  [name] by phone today.  What matters to the patients health and wellness today?  Patient spoke of relationships with his neighbors. Spoke of housing needs    Goals Addressed             This Visit's Progress    Patient spoke of relationships with his neighbors. Spoke of housing needs       Interventions:  Spoke with client about client needs Talked with Trevius about housing situation . Talked with Jerome about relationships with his neighbors. Client and LCSW spoke of client transport needs. He has a truck he uses for short trips Provided counseling support Discussed medication procurement  Used Active Listening techniques to listen to client concerns Discussed recent dental appointment.  Discussed dental history Discussed medical needs Discussed pain issues of client Discussed client upcoming medical physical in July of 2024. Discussed client recent refill of a prescribed medication. He takes medications as prescribed  Discussed HIPAA guidelines for patient privacy Encouraged client to call LCSW as needed at [phone].           SDOH assessments and interventions completed:  Yes  SDOH Interventions Today    Flowsheet Row Most Recent Value  SDOH Interventions   Depression Interventions/Treatment  Counseling  Financial Strain Interventions Other (Comment)  [has some financial needs]  Stress Interventions Provide Counseling  [stress related to managing medical needs]        Care Coordination Interventions:  Yes, provided   Interventions Today    Flowsheet Row Most Recent Value  Chronic Disease   Chronic disease during today's visit Other  [spoke with client about client needs]  General Interventions   General Interventions  Discussed/Reviewed General Interventions Discussed, Community Resources  [discussed program services]  Exercise Interventions   Exercise Discussed/Reviewed Physical Activity  Education Interventions   Education Provided Provided Education  Provided Verbal Education On [employer]  [discussed housing needs. discussed relationships with his neighbors]  Mental Health Interventions   Mental Health Discussed/Reviewed Anxiety, Coping Strategies  [discussed anxiety and stress issues]  Pharmacy Interventions   Pharmacy Dicussed/Reviewed Pharmacy Topics Discussed        Follow up plan: Follow up call scheduled for 07/26/2023 at 11:00 AM    Encounter Outcome:  Pt. Visit Completed {THN Tip this will not be part of the note when signed-REQUIRED REPORT FIELD DO NOT DELETE (Optional):27901  [name].Izsak Meir MSW, LCSW Licensed Visual merchandiser [facility] Care Management [phone]

## 2023-07-01 DIAGNOSIS — E785 Hyperlipidemia, unspecified: Secondary | ICD-10-CM | POA: Diagnosis not present

## 2023-07-01 DIAGNOSIS — Z008 Encounter for other general examination: Secondary | ICD-10-CM | POA: Diagnosis not present

## 2023-07-01 DIAGNOSIS — F4310 Post-traumatic stress disorder, unspecified: Secondary | ICD-10-CM | POA: Diagnosis not present

## 2023-07-01 DIAGNOSIS — F411 Generalized anxiety disorder: Secondary | ICD-10-CM | POA: Diagnosis not present

## 2023-07-01 DIAGNOSIS — M1990 Unspecified osteoarthritis, unspecified site: Secondary | ICD-10-CM | POA: Diagnosis not present

## 2023-07-01 DIAGNOSIS — K5900 Constipation, unspecified: Secondary | ICD-10-CM | POA: Diagnosis not present

## 2023-07-01 DIAGNOSIS — N529 Male erectile dysfunction, unspecified: Secondary | ICD-10-CM | POA: Diagnosis not present

## 2023-07-01 DIAGNOSIS — K219 Gastro-esophageal reflux disease without esophagitis: Secondary | ICD-10-CM | POA: Diagnosis not present

## 2023-07-01 DIAGNOSIS — N189 Chronic kidney disease, unspecified: Secondary | ICD-10-CM | POA: Diagnosis not present

## 2023-07-01 DIAGNOSIS — I129 Hypertensive chronic kidney disease with stage 1 through stage 4 chronic kidney disease, or unspecified chronic kidney disease: Secondary | ICD-10-CM | POA: Diagnosis not present

## 2023-07-01 DIAGNOSIS — G2581 Restless legs syndrome: Secondary | ICD-10-CM | POA: Diagnosis not present

## 2023-07-14 DIAGNOSIS — E785 Hyperlipidemia, unspecified: Secondary | ICD-10-CM | POA: Diagnosis not present

## 2023-07-14 DIAGNOSIS — F411 Generalized anxiety disorder: Secondary | ICD-10-CM | POA: Diagnosis not present

## 2023-07-14 DIAGNOSIS — Z79899 Other long term (current) drug therapy: Secondary | ICD-10-CM | POA: Diagnosis not present

## 2023-07-14 DIAGNOSIS — R972 Elevated prostate specific antigen [PSA]: Secondary | ICD-10-CM | POA: Diagnosis not present

## 2023-07-14 DIAGNOSIS — F900 Attention-deficit hyperactivity disorder, predominantly inattentive type: Secondary | ICD-10-CM | POA: Diagnosis not present

## 2023-07-14 DIAGNOSIS — Z0000 Encounter for general adult medical examination without abnormal findings: Secondary | ICD-10-CM | POA: Diagnosis not present

## 2023-07-14 DIAGNOSIS — E1140 Type 2 diabetes mellitus with diabetic neuropathy, unspecified: Secondary | ICD-10-CM | POA: Diagnosis not present

## 2023-07-14 DIAGNOSIS — I10 Essential (primary) hypertension: Secondary | ICD-10-CM | POA: Diagnosis not present

## 2023-07-14 DIAGNOSIS — E1165 Type 2 diabetes mellitus with hyperglycemia: Secondary | ICD-10-CM | POA: Diagnosis not present

## 2023-07-14 DIAGNOSIS — Z1211 Encounter for screening for malignant neoplasm of colon: Secondary | ICD-10-CM | POA: Diagnosis not present

## 2023-07-14 DIAGNOSIS — Z87891 Personal history of nicotine dependence: Secondary | ICD-10-CM | POA: Diagnosis not present

## 2023-07-14 DIAGNOSIS — G4700 Insomnia, unspecified: Secondary | ICD-10-CM | POA: Diagnosis not present

## 2023-07-26 ENCOUNTER — Ambulatory Visit: Payer: Self-pay | Admitting: Licensed Clinical Social Worker

## 2023-07-26 NOTE — Patient Outreach (Signed)
  Care Coordination   Follow Up Visit Note   07/26/2023 Name: Dakota Gray MRN: [id number] DOB: Jan 25, 1951  Dakota Gray is a 72 y.o. year old male who sees  Dr. Emilio Aspen for primary care. I spoke with  Dakota Gray by phone today.  What matters to the patients health and wellness today? Patient spoke of PSA levels  and seeing medical providers regarding  PSA levels     Goals Addressed             This Visit's Progress    Patient spoke of PSA levels and seeing medical providers regarding  PSA levels       Interventions:  Spoke with client via phone about client needs Discussed program support for client with RN, LCSW, Pharmacist Discussed client support with PCP Client spoke of PSA level and his working with medical providers about PSA levels of client Provided counseling support to Dakota Gray Discussed food resources. He said he has adequate food supply Discussed utility costs of client. He said his utility bill had gone up recently Discussed client support with urologist Used Active Listening techniques to allow client to share his feelings about his medical needs Discussed insurance of client.   He has [employer] and Medicaid Discussed blood sugar  levels of client. He said he is talking with PCP about his blood sugar levels He is talking with PCP about client A1 C levels.  He talked about doing blood sugar checks daily. He spoke of monitoring his blood sugar levels Spoke of HIPAA and Privacy guidelines regarding patient care Spoke of client seeking GI appointment. Client to see PCP first on 08/08/23 Spoke of pain issues. He has cramps in stomach.  Client spoke of his use of Xanax Spoke of client care with medical providers Spoke with client about his relationship with neighbors. He has one neighbor he visits periodically Discussed sleeping issues. He said he has difficulty sleeping He said PCP prescribed medication for client  to help him  sleep Encouraged Dakota Gray to call LCSW as needed for SW support at [phone]         SDOH assessments and interventions completed:  Yes  SDOH Interventions Today    Flowsheet Row Most Recent Value  SDOH Interventions   Depression Interventions/Treatment  Counseling  Financial Strain Interventions Other (Comment)  [has some financial challenges]  Stress Interventions Provide Counseling  [stress in managing medical needs]        Care Coordination Interventions:  Yes, provided   Interventions Today    Flowsheet Row Most Recent Value  Chronic Disease   Chronic disease during today's visit Other  [spoke with client about client needs]  General Interventions   General Interventions Discussed/Reviewed General Interventions Discussed, [employer]  [discussed program resources]  Exercise Interventions   Exercise Discussed/Reviewed Physical Activity  Education Interventions   Education Provided Provided Education  Provided Verbal Education On [employer]  Mental Health Interventions   Mental Health Discussed/Reviewed Coping Strategies, Anxiety  [discussed mood issues. Has anxiety related to managing medical needs.]  Nutrition Interventions   Nutrition Discussed/Reviewed Nutrition Discussed  Pharmacy Interventions   Pharmacy Dicussed/Reviewed Pharmacy Topics Discussed        Follow up plan: Follow up call scheduled for 08/30/23 at 11:00 AM    Encounter Outcome:  Pt. Visit Completed   Dakota Gray.Dakota Gray MSW, LCSW Licensed Visual merchandiser [facility] Care Management [phone]

## 2023-07-26 NOTE — Patient Instructions (Signed)
Visit Information  Thank you for taking time to visit with me today. Please don't hesitate to contact me if I can be of assistance to you.   Following are the goals we discussed today:   Goals Addressed             This Visit's Progress    Patient spoke of PSA levels and seeing medical providers regarding  PSA levels       Interventions:  Spoke with client via phone about client needs Discussed program support for client with RN, LCSW, Pharmacist Discussed client support with PCP Client spoke of PSA level and his working with medical providers about PSA levels of client Provided counseling support to [name] Discussed food resources. He said he has adequate food supply Discussed utility costs of client. He said his utility bill had gone up recently Discussed client support with urologist Used Active Listening techniques to allow client to share his feelings about his medical needs Discussed insurance of client.   He has [employer] and Medicaid Discussed blood sugar  levels of client. He said he is talking with PCP about his blood sugar levels He is talking with PCP about client A1 C levels.  He talked about doing blood sugar checks daily. He spoke of monitoring his blood sugar levels Spoke of HIPAA and Privacy guidelines regarding patient care Spoke of client seeking GI appointment. Client to see PCP first on 08/08/23 Spoke of pain issues. He has cramps in stomach.  Client spoke of his use of Xanax Spoke of client care with medical providers Spoke with client about his relationship with neighbors. He has one neighbor he visits periodically Discussed sleeping issues. He said he has difficulty sleeping He said PCP prescribed medication for client  to help him sleep Encouraged Kyrus to call LCSW as needed for SW support at [phone]         Our next appointment is by telephone on 08/30/23 at 11:00 AM   Please call the care guide team at [phone] if you need to  cancel or reschedule your appointment.   If you are experiencing a Mental Health or Behavioral Health Crisis or need someone to talk to, please go to [facility] [address] [place] [phone])   The patient verbalized understanding of instructions, educational materials, and care plan provided today and DECLINED offer to receive copy of patient instructions, educational materials, and care plan.   The patient has been provided with contact information for the care management team and has been advised to call with any health related questions or concerns.   [name].Hillari Zumwalt MSW, LCSW Licensed Visual merchandiser [facility] Care Management [phone]

## 2023-08-08 ENCOUNTER — Other Ambulatory Visit: Payer: Self-pay | Admitting: Internal Medicine

## 2023-08-08 DIAGNOSIS — M25562 Pain in left knee: Secondary | ICD-10-CM | POA: Diagnosis not present

## 2023-08-08 DIAGNOSIS — G4700 Insomnia, unspecified: Secondary | ICD-10-CM | POA: Diagnosis not present

## 2023-08-08 DIAGNOSIS — G8929 Other chronic pain: Secondary | ICD-10-CM | POA: Diagnosis not present

## 2023-08-08 DIAGNOSIS — E1165 Type 2 diabetes mellitus with hyperglycemia: Secondary | ICD-10-CM | POA: Diagnosis not present

## 2023-08-08 DIAGNOSIS — R1030 Lower abdominal pain, unspecified: Secondary | ICD-10-CM

## 2023-08-08 DIAGNOSIS — I10 Essential (primary) hypertension: Secondary | ICD-10-CM | POA: Diagnosis not present

## 2023-08-08 DIAGNOSIS — R972 Elevated prostate specific antigen [PSA]: Secondary | ICD-10-CM | POA: Diagnosis not present

## 2023-08-08 DIAGNOSIS — Z1211 Encounter for screening for malignant neoplasm of colon: Secondary | ICD-10-CM | POA: Diagnosis not present

## 2023-08-08 DIAGNOSIS — L719 Rosacea, unspecified: Secondary | ICD-10-CM | POA: Diagnosis not present

## 2023-08-08 DIAGNOSIS — R110 Nausea: Secondary | ICD-10-CM | POA: Diagnosis not present

## 2023-08-26 ENCOUNTER — Ambulatory Visit: Admission: RE | Admit: 2023-08-26 | Payer: Medicare HMO | Source: Ambulatory Visit

## 2023-08-26 DIAGNOSIS — R1030 Lower abdominal pain, unspecified: Secondary | ICD-10-CM

## 2023-08-26 MED ORDER — IOPAMIDOL (ISOVUE-300) INJECTION 61%
500.0000 mL | Freq: Once | INTRAVENOUS | Status: AC | PRN
  Administered 2023-08-26: 100 mL via INTRAVENOUS

## 2023-08-30 ENCOUNTER — Ambulatory Visit: Payer: Self-pay | Admitting: Licensed Clinical Social Worker

## 2023-08-30 NOTE — Patient Instructions (Signed)
Visit Information  Thank you for taking time to visit with me today. Please don't hesitate to contact me if I can be of assistance to you.   Following are the goals we discussed today:   Goals Addressed             This Visit's Progress    Patient spoke of PSA levels and seeing medical providers regarding  PSA levels       Interventions:  Spoke with client via phone about client needs Discussed client management of current needs. He spoke of his residence and getting repairs at his residence Discussed medication procurement Discussed client  medical support. Client sees PCP as scheduled at [facility] at [facility].  Discussed client communication  with neighbors at apartment complex Discussed utility costs at apartment complex and paying for services at apartment complex (phone, water cable) Provided counseling support to Dakota Gray Discussed client support with urologist. Client said he sees PCP as scheduled but is not seeing any other specialists Discussed transport needs of client and use of local bus system Used Active Listening techniques to allow client to share his feelings about his medical needs Client spoke of his use of prescribed medication Discussed pest extermination services at his apartment Discussed patient privacy of client in apartment complex Discussed local Ombudsmen support through [employer] Client said he has called Ombudsmen office and talked with representative of that office. He said he received phone numbers to call (resources ) from that agency Discussed client support through local Police department Discussed [employer] [place] support/resources. Client said he has talked with representative from Legal Aid of  Encouraged  Dakota Gray to call LCSW as needed for SW support at [phone]         Our next appointment is by telephone on 10/18/23 at 3:30 PM   Please call the care guide team at [phone] if you need to cancel or  reschedule your appointment.   If you are experiencing a Mental Health or Behavioral Health Crisis or need someone to talk to, please go to [facility] [address], [place] [phone])   The patient verbalized understanding of instructions, educational materials, and care plan provided today and DECLINED offer to receive copy of patient instructions, educational materials, and care plan.   The patient has been provided with contact information for the care management team and has been advised to call with any health related questions or concerns.   [name].Tangala Wiegert MSW, LCSW Licensed Visual merchandiser [facility] Care Management [phone]

## 2023-08-30 NOTE — Patient Outreach (Signed)
  Care Coordination   Follow Up Visit Note   08/30/2023 Name: Stephen Ristine MRN: [id number] DOB: 21-Jun-1951  Nore Kanode is a 72 y.o. year old male who sees Pcp, No for primary care. I spoke with  [name] by phone today.  What matters to the patients health and wellness today?  Patient spoke of client needs, apartment issues, financial needs, privacy issues of concern    Goals Addressed             This Visit's Progress    Patient spoke of PSA levels and seeing medical providers regarding  PSA levels       Interventions:  Spoke with client via phone about client needs Discussed client management of current needs. He spoke of his residence and getting repairs at his residence Discussed medication procurement Discussed client  medical support. Client sees PCP as scheduled at [facility] at [facility].  Discussed client communication  with neighbors at apartment complex Discussed utility costs at apartment complex and paying for services at apartment complex (phone, water cable) Provided counseling support to [name] Discussed client support with urologist. Client said he sees PCP as scheduled but is not seeing any other specialists Discussed transport needs of client and use of local bus system Used Active Listening techniques to allow client to share his feelings about his medical needs Client spoke of his use of prescribed medication Discussed pest extermination services at his apartment Discussed patient privacy of client in apartment complex Discussed local Ombudsmen support through [employer] Client said he has called Ombudsmen office and talked with representative of that office. He said he received phone numbers to call (resources ) from that agency Discussed client support through local Police department Discussed [employer] [place] support/resources. Client said he has talked with representative from [employer] [place] Encouraged  Kenye to call LCSW  as needed for SW support at [phone]         SDOH assessments and interventions completed:  Yes  SDOH Interventions Today    Flowsheet Row Most Recent Value  SDOH Interventions   Depression Interventions/Treatment  Counseling  Financial Strain Interventions Other (Comment)  [some financial strain]  Stress Interventions Provide Counseling  [stress related to finances,  stress in managing daily needs. stress related to managing medical needs]        Care Coordination Interventions:  Yes, provided   Interventions Today    Flowsheet Row Most Recent Value  Chronic Disease   Chronic disease during today's visit Other  [spoke with client about client needs]  General Interventions   General Interventions Discussed/Reviewed General Interventions Discussed, Community Resources  Education Interventions   Education Provided Provided Education  Provided Verbal Education On [employer]  Mental Health Interventions   Mental Health Discussed/Reviewed Coping Strategies  [discussed coping with anxiety issues faced]  Nutrition Interventions   Nutrition Discussed/Reviewed Nutrition Discussed  Pharmacy Interventions   Pharmacy Dicussed/Reviewed Pharmacy Topics Discussed        Follow up plan: Follow up call scheduled for 10/18/23 at 3:30 PM     Encounter Outcome:  Pt. Visit Completed   [name].Persephonie Hegwood MSW, LCSW Licensed Visual merchandiser [facility] Care Management [phone]

## 2023-10-10 ENCOUNTER — Ambulatory Visit: Payer: Self-pay | Admitting: Licensed Clinical Social Worker

## 2023-10-10 NOTE — Patient Outreach (Signed)
Care Coordination   Follow Up Visit Note   10/10/2023 Name: Dakota Gray MRN: [id number] DOB: August 27, 1951  Dakota Gray is a 72 y.o. year old male who sees Pcp, No for primary care. I spoke with  [name] by phone today.  What matters to the patients health and wellness today? Patient is trying to locate new apartment    Goals Addressed             This Visit's Progress    Patient Stated he is trying to locate new apartment       Interventions:   Spoke with client about client needs Discussed housing needs. Client is looking for new apartment in [place], [place] Client has applied to one apartment complex in [place], [place]. Complex is taking applications at this time Discussed LCSW communication with housing complex via email.today Encouraged client to contact LCSW if client receives information from apartment complex in [place], [place] Client appreciative of phone call from LCSW today         SDOH assessments and interventions completed:  Yes  SDOH Interventions Today    Flowsheet Row Most Recent Value  SDOH Interventions   Depression Interventions/Treatment  Counseling  Stress Interventions Provide Counseling  [has stress in managing medical needs]        Care Coordination Interventions:  Yes, provided  Interventions Today    Flowsheet Row Most Recent Value  Chronic Disease   Chronic disease during today's visit Other  [spoke with client about client needs]  General Interventions   General Interventions Discussed/Reviewed General Interventions Discussed, Community Resources  Education Interventions   Education Provided Provided Education  Provided Engineer, petroleum On [employer]  [discussed apartment locations in [place], [place]]  Mental Health Interventions   Mental Health Discussed/Reviewed Coping Strategies  [discussed anxiety and stress management of client]       Follow up plan: LCSW to call client as scheduled to assess housing needs of  client   Encounter Outcome:  Patient Visit Completed   [name].Marlita Keil MSW, LCSW Licensed Visual merchandiser [facility] Care Management [phone]

## 2023-10-10 NOTE — Patient Instructions (Signed)
Visit Information  Thank you for taking time to visit with me today. Please don't hesitate to contact me if I can be of assistance to you.   Following are the goals we discussed today:   Goals Addressed             This Visit's Progress    Patient Stated he is trying to locate new apartment       Interventions:   Spoke with client about client needs Discussed housing needs. Client is looking for new apartment in [place], [place] Client has applied to one apartment complex in [place], [place]. Complex is taking applications at this time Discussed LCSW communication with housing complex via email.today Encouraged client to contact LCSW if client receives information from apartment complex in [place], [place] Client appreciative of phone call from LCSW today        LCSW to call client as scheduled to assess client housing needs   Please call the care guide team at [phone] if you need to cancel or reschedule your appointment.   If you are experiencing a Mental Health or Behavioral Health Crisis or need someone to talk to, please go to [facility] [address], [place] [phone])   The patient verbalized understanding of instructions, educational materials, and care plan provided today and DECLINED offer to receive copy of patient instructions, educational materials, and care plan.   The patient has been provided with contact information for the care management team and has been advised to call with any health related questions or concerns.   [name].Francena Zender MSW, LCSW Licensed Visual merchandiser [facility] Care Management [phone]

## 2023-10-18 ENCOUNTER — Ambulatory Visit: Payer: Self-pay | Admitting: Licensed Clinical Social Worker

## 2023-10-18 NOTE — Patient Outreach (Signed)
Care Coordination   Follow Up Visit Note   10/18/2023 Name: Dakota Gray MRN: [id number] DOB: [birth year]  Dakota Gray is a 72 y.o. year old male who sees Pcp, No for primary care. I spoke with  Dakota Gray by phone today.  What matters to the patients health and wellness today?  Patient wants to live independently and receive medical care as needed    Goals Addressed               This Visit's Progress     patient wants to live independently and receive medical care as needed (pt-stated)        Care Interventions:  LCSW talked via phone  today with client, Dakota Gray about client needs Dakota Gray is seeking a new apartment where he could reside. He has applied to apartment complex in [place] Provided counseling support for client Reviewed transport needs. He drives his car to local appointments and to complete errands. He said his car needs some repair work done. He does not drive his car long distances. Discussed application process at new apartment complex. He is waiting to see if he is approved  to reside at new apartment complex Used Active Listening techniques to allow client to vent his feelings and concerns Discussed utility usage of client and current costs for utility usage of client Discussed Expanded Medicaid coverage and ways to get answers to questions about Medicaid  Expanded coverage Client applied to new apartment complex in [place], [place] in August of 2024. He has applied to [employer]. Dakota Gray for phone call with LCSW today Encouraged Dakota Gray to call LCSW for SW support as needed at [phone]          SDOH assessments and interventions completed:  Yes  SDOH Interventions Today    Flowsheet Row Most Recent Value  SDOH Interventions   Depression Interventions/Treatment  Counseling  Stress Interventions Provide Counseling  [stress  in managing medical needs]        Care Coordination Interventions:  Yes, provided    Interventions Today    Flowsheet Row Most Recent Value  Chronic Disease   Chronic disease during today's visit Other  [spoke with client about client needs]  General Interventions   General Interventions Discussed/Reviewed General Interventions Discussed, Community Resources  Education Interventions   Education Provided Provided Education  Provided Verbal Education On [employer]  Nutrition Interventions   Nutrition Discussed/Reviewed Nutrition Discussed  Pharmacy Interventions   Pharmacy Dicussed/Reviewed Pharmacy Topics Discussed        Follow up plan: LCSW has provided client with LCSW name and phone number. Client to call LCSW as needed to discuss SW needs of client   Encounter Outcome:  Patient Visit Completed   [name].Matelyn Antonelli MSW, LCSW Licensed Visual merchandiser [facility] Care Management [phone]

## 2023-10-18 NOTE — Patient Instructions (Signed)
Visit Information  Thank you for taking time to visit with me today. Please don't hesitate to contact me if I can be of assistance to you.   Following are the goals we discussed today:   Goals Addressed               This Visit's Progress     patient wants to live independently and receive medical care as needed (pt-stated)        Care Interventions:  LCSW talked via phone  today with client, Dakota Gray about client needs Dakota Gray is seeking a new apartment where he could reside. He has applied to apartment complex in [place] Provided counseling support for client Reviewed transport needs. He drives his car to local appointments and to complete errands. He said his car needs some repair work done. He does not drive his car long distances. Discussed application process at new apartment complex. He is waiting to see if he is approved  to reside at new apartment complex Used Active Listening techniques to allow client to vent his feelings and concerns Discussed utility usage of client and current costs for utility usage of client Discussed Expanded Medicaid coverage and ways to get answers to questions about Medicaid  Expanded coverage Client applied to new apartment complex in [place], [place] in August of 2024. He has applied to [employer]. [name] for phone call with LCSW today Encouraged [name] to call LCSW for SW support as needed at [phone]         LCSW has provided client with LCSW name and phone number. Client to call LCSW as needed to discuss SW needs of client  Please call the care guide team at [phone] if you need to cancel or reschedule your appointment.   If you are experiencing a Mental Health or Behavioral Health Crisis or need someone to talk to, please go to [facility] [address], [place] [phone])   The patient verbalized understanding of instructions, educational materials,  and care plan provided today and DECLINED offer to receive copy of patient instructions, educational materials, and care plan.   The patient has been provided with contact information for the care management team and has been advised to call with any health related questions or concerns.   [name].Talaysia Pinheiro MSW, LCSW Licensed Visual merchandiser [facility] Care Management [phone]

## 2023-10-31 ENCOUNTER — Ambulatory Visit: Payer: Self-pay | Admitting: Licensed Clinical Social Worker

## 2023-10-31 NOTE — Patient Instructions (Signed)
Visit Information  Thank you for taking time to visit with me today. Please don't hesitate to contact me if I can be of assistance to you.   Following are the goals we discussed today:   Goals Addressed             This Visit's Progress    Patient Stated he is trying to locate new apartment       Interventions:   Spoke with client via phone today about client needs and status Client is still trying to locate apartment where he can live. He has applied to [facility] but has not heard back from group about status of his application. He understands that group is doing background checks on applicants Discussed current housing location and needs. He said that his landlord has encouraged him to start looking for other possible place to live. He is looking at apartment complexes in area. He has not received a notice to vacate from landlord; but, he is not sure how many more months he will be allowed to continue to reside at current location LCSW has called [facility] and left phone message for Tracie Harrier asking for return call to LCSW at [phone] to discuss client questions regarding transport help and Expanded Medicaid program enrollment Gave client information about [employer] 211 support Client has not heard back from agency where he applied to reside (new apartment complex: [facility]). Provided counseling support Encouraged client to contact LCSW if client receives information from apartment complex in [place], [place] Client appreciative of phone call from LCSW today        Client has LCSW name and phone number and will call LCSW as needed for SW support at [phone].   Please call the care guide team at [phone] if you need to cancel or reschedule your appointment.   If you are experiencing a Mental Health or Behavioral Health Crisis or need someone to talk to, please go to [facility] [address] [place] [phone])   The patient verbalized understanding of instructions, educational materials, and care plan provided today and DECLINED offer to receive copy of patient instructions, educational materials, and care plan.   The patient has been provided with contact information for the care management team and has been advised to call with any health related questions or concerns.   Dakota Gray.Dakota Gray MSW, LCSW Licensed Visual merchandiser [facility] Care Management [phone]

## 2023-10-31 NOTE — Patient Outreach (Signed)
  Care Coordination   Follow Up Visit Note   10/31/2023 Name: Dakota Gray MRN: [id number] DOB: [birth year]  Dakota Gray is a 72 y.o. year old male who sees Pcp, No for primary care. I spoke with  Dakota Gray by phone today.  What matters to the patients health and wellness today?   Patient is trying to locate a new apartment  where he could reside    Goals Addressed             This Visit's Progress    Patient Stated he is trying to locate new apartment       Interventions:   Spoke with client via phone today about client needs and status Client is still trying to locate apartment where he can live. He has applied to [facility] but has not heard back from group about status of his application. He understands that group is doing background checks on applicants Discussed current housing location and needs. He said that his landlord has encouraged him to start looking for other possible place to live. He is looking at apartment complexes in area. He has not received a notice to vacate from landlord; but, he is not sure how many more months he will be allowed to continue to reside at current location LCSW has called [facility] and left phone message for Tracie Harrier asking for return call to LCSW at [phone] to discuss client questions regarding transport help and Expanded Medicaid program enrollment Client has not heard back from agency where he applied to reside (new apartment complex: [facility]). Provided counseling support Encouraged client to contact LCSW if client receives information from apartment complex in [place], [place] Client appreciative of phone call from LCSW today         SDOH assessments and interventions completed:  Yes  SDOH Interventions Today    Flowsheet Row Most Recent Value  SDOH Interventions   Depression Interventions/Treatment  Counseling  Stress Interventions Provide Counseling  [has stress  related to housing needs,  he is looking for a new apartment where he can live]        Care Coordination Interventions:  Yes, provided   Interventions Today    Flowsheet Row Most Recent Value  Chronic Disease   Chronic disease during today's visit Other  [spoke with client about client needs]  General Interventions   General Interventions Discussed/Reviewed General Interventions Discussed, Community Resources  Exercise Interventions   Exercise Discussed/Reviewed Physical Activity  Education Interventions   Education Provided Provided Education  Provided Verbal Education On [employer]  [Discussed [employer] 211 assistance line with client]  Mental Health Interventions   Mental Health Discussed/Reviewed Mental Health Discussed, Coping Strategies  [trying to manage stress and anxiety issues related to housing needs]        Follow up plan: Client has LCSW name and phone number and will call LCSW as needed for SW support at [phone].   Encounter Outcome:  Patient Visit Completed   [name].Jenet Durio MSW, LCSW Licensed Visual merchandiser [facility] Care Management [phone]

## 2023-11-01 ENCOUNTER — Ambulatory Visit: Payer: Self-pay | Admitting: Licensed Clinical Social Worker

## 2023-11-01 NOTE — Patient Outreach (Signed)
  Care Coordination   Follow Up Visit Note   11/01/2023 Name: Arnaldo Heffron MRN: [id number] DOB: September 29, 1951  Jeremih Dearmas is a 72 y.o. year old male who sees Pcp, No for primary care. I spoke with  [name] by phone today.  What matters to the patients health and wellness today? Patient is trying to locate new apartment where he can reside     Goals Addressed             This Visit's Progress    Patient Stated he is trying to locate new apartment       Interventions:   Spoke with client via phone today about client needs and status Informed client that LCSW had sent secure email to apartment complex in [place], [place] where client had applied. In secure email LCSW has asked for update on application status of client.   LCSW also informed Avel that Expanded Medicaid only goes until participants reach age of 108. At age 25 years participants are no longer able to participate in Expanded Medicaid program.  Informed Yehudah that LCSW had called [facility] [place] [facility] yesterday regarding contact phone number and name for [place] to make transport arrangements for client through his Medicaid benefit Client appreciative of phone call from LCSW today         SDOH assessments and interventions completed:  Yes  SDOH Interventions Today    Flowsheet Row Most Recent Value  SDOH Interventions   Depression Interventions/Treatment  Counseling  Stress Interventions Provide Counseling        Care Coordination Interventions:  Yes, provided  Interventions Today    Flowsheet Row Most Recent Value  Chronic Disease   Chronic disease during today's visit Other  [spoke with client about client needs]  General Interventions   General Interventions Discussed/Reviewed General Interventions Discussed, Community Resources  Education Interventions   Education Provided Provided Education  Provided Verbal Education On [employer]  Mental Health Interventions   Mental Health  Discussed/Reviewed Coping Strategies       Follow up plan: Client has LCSW name and phone number and will call LCSW as needed to discuss SW needs of client    Encounter Outcome:  Patient Visit Completed   [name].Lexianna Weinrich MSW, LCSW Licensed Visual merchandiser [facility] Care Management [phone]

## 2023-11-01 NOTE — Patient Instructions (Signed)
Visit Information  Thank you for taking time to visit with me today. Please don't hesitate to contact me if I can be of assistance to you.   Following are the goals we discussed today:   Goals Addressed             This Visit's Progress    Patient Stated he is trying to locate new apartment       Interventions:   Spoke with client via phone today about client needs and status Informed client that LCSW had sent secure email to apartment complex in [place], [place] where client had applied. In secure email LCSW has asked for update on application status of client.   LCSW also informed Darivs that Expanded Medicaid only goes until participants reach age of 61. At age 70 years participants are no longer able to participate in Expanded Medicaid program.  Informed Dak that LCSW had called [facility] [place] [facility] yesterday regarding contact phone number and name for Gar to make transport arrangements for client through his Medicaid benefit Client appreciative of phone call from LCSW today        Client has LCSW name and phone number and will call LCSW as needed to discuss SW needs of client  Please call the care guide team at [phone] if you need to cancel or reschedule your appointment.   If you are experiencing a Mental Health or Behavioral Health Crisis or need someone to talk to, please go to [facility] [address] [place] [phone])   The patient verbalized understanding of instructions, educational materials, and care plan provided today and DECLINED offer to receive copy of patient instructions, educational materials, and care plan.   The patient has been provided with contact information for the care management team and has been advised to call with any health related questions or concerns.   [name].Loyalty Arentz MSW, LCSW Licensed Visual merchandiser [facility] Care Management [phone]

## 2023-11-02 ENCOUNTER — Ambulatory Visit: Payer: Self-pay | Admitting: Licensed Clinical Social Worker

## 2023-11-02 NOTE — Patient Outreach (Signed)
  Care Coordination   Follow Up Visit Note   11/02/2023 Name: Dakota Gray MRN: [id number] DOB: Jul 04, 1951  Etienne Mowers is a 72 y.o. year old male who sees Pcp, No for primary care. I spoke with  [name] by phone today.  What matters to the patients health and wellness today?  Patient stated he is trying to locate new apartment     Goals Addressed             This Visit's Progress    Patient Stated he is trying to locate new apartment       Interventions:   Spoke with client via phone today about client needs and status Spoke with client about email received by LCSW from [employer] [facility] regarding client application status Discussed housing options for client. Discussed apartment complexes in [place], [place] area Discussed [address] support for housing. Informed client he could call 211 and talk with [address] representative about rent controlled apartments in [place], [place] area Used Active Listening skills to hear client concerns Provided counseling support for client Talked about client history of applying for apartments in [place], [place] Talked with client about current housing location Thanked client for phone call with LCSW today Encouraged client to call LCSW as needed for SW support at [phone].           SDOH assessments and interventions completed:  Yes  SDOH Interventions Today    Flowsheet Row Most Recent Value  SDOH Interventions   Depression Interventions/Treatment  Counseling  Stress Interventions Provide Counseling        Care Coordination Interventions:  Yes, provided  Interventions Today    Flowsheet Row Most Recent Value  Chronic Disease   Chronic disease during today's visit Other  [spoke with client about client needs]  General Interventions   General Interventions Discussed/Reviewed General Interventions Discussed, Community Resources  Education Interventions   Education Provided Provided Education  Provided Verbal  Education On [employer]  Mental Health Interventions   Mental Health Discussed/Reviewed Coping Strategies  [client using coping skills to manage stress issues]  Pharmacy Interventions   Pharmacy Dicussed/Reviewed Pharmacy Topics Discussed       Follow up plan: Client has LCSW name and phone number. Client will call LCSW as needed for SW support at [phone].   Encounter Outcome:  Patient Visit Completed   [name].Inger Wiest MSW, LCSW Licensed Clinical Social Worker [facility] Care Management [phone]. [phone]

## 2023-11-02 NOTE — Patient Instructions (Signed)
Visit Information  Thank you for taking time to visit with me today. Please don't hesitate to contact me if I can be of assistance to you.   Following are the goals we discussed today:   Goals Addressed             This Visit's Progress    Patient Stated he is trying to locate new apartment       Interventions:   Spoke with client via phone today about client needs and status Spoke with client about email received by LCSW from [employer] [facility] regarding client application status Discussed housing options for client. Discussed apartment complexes in [place], [place] area Discussed [address] support for housing. Informed client he could call 211 and talk with [address] representative about rent controlled apartments in [place], [place] area Used Active Listening skills to hear client concerns Provided counseling support for client Talked about client history of applying for apartments in [place], [place] Talked with client about current housing location Thanked client for phone call with LCSW today Encouraged client to call LCSW as needed for SW support at [phone].          Client has LCSW name and phone number. Client will call LCSW as needed for SW support for client at [phone]   Please call the care guide team at [phone] if you need to cancel or reschedule your appointment.   If you are experiencing a Mental Health or Behavioral Health Crisis or need someone to talk to, please go to [facility] [address], [place] [phone])   The patient verbalized understanding of instructions, educational materials, and care plan provided today and DECLINED offer to receive copy of patient instructions, educational materials, and care plan.   The patient has been provided with contact information for the care management team and has been advised to call with any health related questions or concerns.   [name].Ralston Venus MSW,  LCSW Licensed Visual merchandiser [facility] Care Management [phone]

## 2023-11-29 ENCOUNTER — Ambulatory Visit: Payer: Self-pay | Admitting: Licensed Clinical Social Worker

## 2023-11-29 NOTE — Patient Instructions (Signed)
Visit Information  Thank you for taking time to visit with me today. Please don't hesitate to contact me if I can be of assistance to you.   Following are the goals we discussed today:   Goals Addressed             This Visit's Progress    Patient Stated he is trying to locate new apartment       Interventions:   Spoke with client via phone about client needs and status Client said he had appointment scheduled to sign a lease on new apartment.  He said he plans to sign this lease and will have until December 27, 2023 to move his belongings into the new apartment Provided counseling support for client Used Active Listening Techniques to allow client to share his feelings Discussed client plans to move his belongings and how he would move heavier items Talked with client about his rent payment at current apartment location  Encouraged client to call LCSW at [phone] as needed to discuss SW needs of client         Patient has LCSW name and phone number and agreed to call LCSW as needed for SW support  Please call the care guide team at [phone] if you need to cancel or reschedule your appointment.   If you are experiencing a Mental Health or Behavioral Health Crisis or need someone to talk to, please go to [facility] [address], [place] [phone])   The patient verbalized understanding of instructions, educational materials, and care plan provided today and DECLINED offer to receive copy of patient instructions, educational materials, and care plan.   The patient has been provided with contact information for the care management team and has been advised to call with any health related questions or concerns.   [name].Vong Garringer MSW, LCSW Licensed Visual merchandiser [facility] Care Management [phone]

## 2023-11-29 NOTE — Patient Outreach (Signed)
  Care Coordination   Follow Up Visit Note   11/29/2023 Name: Dakota Gray MRN: [id number] DOB: [birth year]  Dakota Gray is a 72 y.o. year old male who sees Pcp, No for primary care. I spoke with  Dakota Gray by phone.  What matters to the patients health and wellness today?  Patient stated he is trying to locate new apartment     Goals Addressed             This Visit's Progress    Patient Stated he is trying to locate new apartment       Interventions:   Spoke with client via phone about client needs and status Client said he had appointment scheduled to sign a lease on new apartment.  He said he plans to sign this lease and will have until December 27, 2023 to move his belongings into the new apartment Provided counseling support for client Used Active Listening Techniques to allow client to share his feelings Discussed client plans to move his belongings and how he would move heavier items Talked with client about his rent payment at current apartment location  Encouraged client to call LCSW at [phone] as needed to discuss SW needs of client          SDOH assessments and interventions completed:  Yes  SDOH Interventions Today    Flowsheet Row Most Recent Value  SDOH Interventions   Depression Interventions/Treatment  Counseling  Stress Interventions Provide Counseling        Care Coordination Interventions:  Yes, provided   Follow up plan: Patient has LCSW name and phone number and agreed to call LCSW as needed for SW support   Encounter Outcome:  Patient Visit Completed   [name].Ferrin Liebig MSW, LCSW Licensed Visual merchandiser [facility] Care Management [phone]

## 2023-12-06 ENCOUNTER — Ambulatory Visit: Payer: Self-pay | Admitting: Licensed Clinical Social Worker

## 2023-12-06 NOTE — Patient Instructions (Signed)
Visit Information  Thank you for taking time to visit with me today. Please don't hesitate to contact me if I can be of assistance to you.   Following are the goals we discussed today:   Goals Addressed             This Visit's Progress    Patient Stated he is trying to make arrangement to move to new apartment.       Interventions:   Spoke with client via phone about client needs and status Client said he received jury summons and is trying to determine how to manage jury summons He has transport difficulty. LCSW and client spoke of transport options for client related to jury duty summons Client said his vehicle overheats and he cannot drive it more than a few miles at a time for fear of vehicle overheating. Client to call courthouse today after 6:00 PM to see if he had to report to jury duty tomorrow Client spoke of moving costs . He is talking with several moving companies to get quote on moving costs with various moving companies Client was anxious due to moving needs and related to jury summons LCSW encouraged client to call LCSW as needed for SW support at [phone] Client was appreciative of call with LCSW today          Patient has LCSW phone number and will call LCSW as needed for SW support   Please call the care guide team at [phone] if you need to cancel or reschedule your appointment.   If you are experiencing a Mental Health or Behavioral Health Crisis or need someone to talk to, please go to [facility] [address] [place] [phone])   The patient verbalized understanding of instructions, educational materials, and care plan provided today and DECLINED offer to receive copy of patient instructions, educational materials, and care plan.   The patient has been provided with contact information for the care management team and has been advised to call with any health related questions or concerns.   Dakota Gray MSW, LCSW Licensed Visual merchandiser [facility] Care Management [phone]

## 2023-12-06 NOTE — Patient Outreach (Signed)
  Care Coordination   Follow Up Visit Note   12/06/2023 Name: Jaco Treas MRN: [id number] DOB: 06-27-1951  Marqavious Brouillet is a 72 y.o. year old male who sees Pcp, No for primary care. I spoke with  [name] by phone today.  What matters to the patients health and wellness today? Patient Stated he is trying to make arrangement to move to new apartment.    Goals Addressed             This Visit's Progress    Patient Stated he is trying to make arrangement to move to new apartment.       Interventions:   Spoke with client via phone about client needs and status Client said he received jury summons and is trying to determine how to manage jury summons He has transport difficulty. LCSW and client spoke of transport options for client related to jury duty summons Client said his vehicle overheats and he cannot drive it more than a few miles at a time for fear of vehicle overheating. Client to call courthouse today after 6:00 PM to see if he had to report to jury duty tomorrow Client spoke of moving costs . He is talking with several moving companies to get quote on moving costs with various moving companies Client was anxious due to moving needs and related to jury summons LCSW encouraged client to call LCSW as needed for SW support at [phone] Client was appreciative of call with LCSW today           SDOH assessments and interventions completed:  Yes  SDOH Interventions Today    Flowsheet Row Most Recent Value  SDOH Interventions   Depression Interventions/Treatment  Counseling  Stress Interventions Provide Counseling        Care Coordination Interventions:  Yes, provided   Interventions Today    Flowsheet Row Most Recent Value  Chronic Disease   Chronic disease during today's visit Other  [spoke with client about client needs]  General Interventions   General Interventions Discussed/Reviewed General Interventions Discussed, Community Resources   Education Interventions   Education Provided Provided Education  Provided Verbal Education On [employer]  Mental Health Interventions   Mental Health Discussed/Reviewed Coping Strategies  [client has stress issues related to moving to new apartment]        Follow up plan: Patient has LCSW phone number and will call LCSW as needed for SW support   Encounter Outcome:  Patient Visit Completed   [name].Thorn Demas MSW, LCSW Licensed Visual merchandiser [facility] Care Management [phone]

## 2024-01-10 ENCOUNTER — Ambulatory Visit: Payer: Self-pay | Admitting: Licensed Clinical Social Worker

## 2024-01-10 NOTE — Patient Outreach (Signed)
  Care Coordination   Follow Up Visit Note   01/10/2024 Name: Dakota Gray MRN: [id number] DOB: 1951/07/11  Dakota Gray is a 73 y.o. year old male who sees Pcp, No for primary care. I spoke with  Dakota Gray by phone today.  What matters to the patients health and wellness today? patient has transportation needs. His vehicle has mechanical issues. Client seeking transportation resources    Goals Addressed             This Visit's Progress    patient has transportation needs. His vehicle has mechanical issues. Client seeking transportation resources       Interventions:  Spoke with client today about client needs Client said he needs help with transportation resource.  He said he has medical appointment this Thursday with MD and his truck is not running.  LCSW provided client with name of [facility] [facility] [employer] [facility] in [place], [place] (phone:  [phone]).  Agency is approved for Medicaid transport.  Client wrote down name and phone number of transport agency.  Dakota Gray said he would call agency today and talk with agency representative about possible transport help for him to go to and from his MD appointment on Thursday of this week Client has [employer] and Medicaid as insurance.  He is having transportation issues Client recently moved to new apartment in [place], [place] and is enjoying his new apartment in [place], [place].  Client has reduced family support .  He advocates well for himself and enjoys looking up resources on the internet.   Provided some counseling support for client Encouraged client to call LCSW as needed for SW support at [phone].        SDOH assessments and interventions completed:  Yes  SDOH Interventions Today    Flowsheet Row Most Recent Value  SDOH Interventions   Depression Interventions/Treatment  Counseling  Stress Interventions Provide Counseling  [has stress related to transport issues,  has financial strain issues]        Care  Coordination Interventions:  Yes, provided   Interventions Today    Flowsheet Row Most Recent Value  Chronic Disease   Chronic disease during today's visit Other  [spoke with client about client needs]  General Interventions   General Interventions Discussed/Reviewed General Interventions Discussed, Programmer, Applications  [discussed transportation resource:  [employer]]  Education Interventions   Education Provided Provided Education  Provided Engineer, [employer] On [employer]  Mental Health Interventions   Mental Health Discussed/Reviewed Coping Strategies  [has anxiety issues faced. has transport needs]       Follow up plan: client has LCSW name and phone number.  Client will call LCSW as needed to discuss social work needs of client   Encounter Outcome:  Patient Visit Completed   Ozell RAMAN.Dudley Cooley MSW, LCSW Licensed Visual Merchandiser [facility] Care Management [phone]

## 2024-01-10 NOTE — Patient Instructions (Signed)
 Visit Information  Thank you for taking time to visit with me today. Please don't hesitate to contact me if I can be of assistance to you.   Following are the goals we discussed today:   Goals Addressed             This Visit's Progress    patient has transportation needs. His vehicle has mechanical issues. Client seeking transportation resources       Interventions:  Spoke with client today about client needs Client said he needs help with transportation resource.  He said he has medical appointment this Thursday with MD and his truck is not running.  LCSW provided client with name of [facility] [employer] [facility] in [place], [place] (phone:  [phone]).  Agency is approved for Medicaid transport.  Client wrote down name and phone number of transport agency.  [name] said he would call agency today and talk with agency representative about possible transport help for him to go to and from his MD appointment on Thursday of this week Client has [employer] and Medicaid as insurance.  He is having transportation issues Client recently moved to new apartment in [place], [place] and is enjoying his new apartment in [place], [place].  Client has reduced family support .  He advocates well for himself and enjoys looking up resources on the internet.   Provided some counseling support for client Encouraged client to call LCSW as needed for SW support at [phone].        Client has LCSW name and phone number. Client will call LCSW as needed to discuss SW needs of client  Please call the care guide team at [phone] if you need to cancel or reschedule your appointment.   If you are experiencing a Mental Health or Behavioral Health Crisis or need someone to talk to, please go to [facility] [address] [place] [phone])   The patient verbalized understanding of instructions, educational materials, and care plan provided today and DECLINED  offer to receive copy of patient instructions, educational materials, and care plan.   The patient has been provided with contact information for the care management team and has been advised to call with any health related questions or concerns.   Dakota Gray.Ming Kunka MSW, LCSW Licensed Visual Merchandiser [facility] Care Management [phone]

## 2024-01-11 ENCOUNTER — Ambulatory Visit: Payer: Self-pay | Admitting: Licensed Clinical Social Worker

## 2024-01-11 NOTE — Patient Instructions (Signed)
 Visit Information  Thank you for taking time to visit with me today. Please don't hesitate to contact me if I can be of assistance to you.   Following are the goals we discussed today:   Goals Addressed             This Visit's Progress    patient has transportation needs. His vehicle has mechanical issues. Client seeking transportation resources       Interventions:  Spoke with client via phone about his current needs and status Client is seeking transport help for appointment with doctor on Thursday of this week.  His vehicle has mechanical issues LCSW called [facility]  [facility] and talked with representative about client assistance with transport.  Representative of agency said that agency is Medicaid approved but does not bill Medicaid itself.  Representative said client would need to call [employer] at [phone] for assistance with billing Medicaid for client transport needs LCSW thanked representative of [facility] [facility] LCSW informed Alexa of above information. Burdette wrote down name of [employer] and wrote down phone number of [employer]. Demeco to call [employer] to seek assistance with billing Medicaid for client transport needs Client has [employer] and Medicaid as insurance.  Encouraged client to call LCSW as needed for SW support at [phone].         Client has LCSW name and phone number.  Client to call LCSW as needed for SW support  Please call the care guide team at [phone] if you need to cancel or reschedule your appointment.   If you are experiencing a Mental Health or Behavioral Health Crisis or need someone to talk to, please go to [facility] [address] [place] [phone])   The patient verbalized understanding of instructions, educational materials, and care plan provided today and DECLINED offer to receive copy of patient instructions, educational materials, and care  plan.   The patient has been provided with contact information for the care management team and has been advised to call with any health related questions or concerns.   Larene Pleasant.Gisela Lea MSW, LCSW Licensed Visual merchandiser [facility] Care Management [phone]

## 2024-01-11 NOTE — Patient Outreach (Signed)
  Care Coordination   Follow Up Visit Note   01/11/2024 Name: Dakota Gray MRN: [id number] DOB: [birth year]  Casmier Tardif is a 73 y.o. year old male who sees Pcp, No for primary care. I spoke with  Azalee Lenz via phone about client needs.    What matters to the patients health and wellness today?  Patient has transportation needs. His vehicle has mechanical issues. Client seeking transportation resources     Goals Addressed             This Visit's Progress    patient has transportation needs. His vehicle has mechanical issues. Client seeking transportation resources       Interventions:  Spoke with client via phone about his current needs and status Client is seeking transport help for appointment with doctor on Thursday of this week.  His vehicle has mechanical issues LCSW called [facility] [facility] [employer]  [facility] and talked with representative about client assistance with transport.  Representative of agency said that agency is Medicaid approved but does not bill Medicaid itself.  Representative said client would need to call [employer] at [phone] for assistance with billing Medicaid for client transport needs LCSW thanked representative of [facility] [facility] LCSW informed Fedele of above information. Zahier wrote down name of [employer] and wrote down phone number of [employer]. Armend to call [employer] to seek assistance with billing Medicaid for client transport needs Client has [employer] and Medicaid as insurance.  Encouraged client to call LCSW as needed for SW support at [phone].          SDOH assessments and interventions completed:  Yes  SDOH Interventions Today    Flowsheet Row Most Recent Value  SDOH Interventions   Depression Interventions/Treatment  Counseling  Stress Interventions Provide Counseling        Care Coordination Interventions:  Yes, provided   Interventions Today    Flowsheet Row Most Recent Value  Chronic  Disease   Chronic disease during today's visit Other  [spoke with client about client needs]  General Interventions   General Interventions Discussed/Reviewed General Interventions Discussed, Community Resources  Education Interventions   Education Provided Provided Education  Provided Verbal Education On [employer]  Mental Health Interventions   Mental Health Discussed/Reviewed Coping Strategies  [client is anxious about transportatoin needs]  Nutrition Interventions   Nutrition Discussed/Reviewed Nutrition Discussed  Pharmacy Interventions   Pharmacy Dicussed/Reviewed Pharmacy Topics Discussed        Follow up plan: LCSW to call client as scheduled to assess client needs at that time. Client to call LCSW as needed for SW support    Encounter Outcome:  Patient Visit Completed   Larene Pleasant.Laresha Bacorn MSW, LCSW Licensed Visual merchandiser [facility] Care Management [phone]

## 2024-01-12 DIAGNOSIS — R972 Elevated prostate specific antigen [PSA]: Secondary | ICD-10-CM | POA: Diagnosis not present

## 2024-01-12 DIAGNOSIS — R1030 Lower abdominal pain, unspecified: Secondary | ICD-10-CM | POA: Diagnosis not present

## 2024-01-12 DIAGNOSIS — E1165 Type 2 diabetes mellitus with hyperglycemia: Secondary | ICD-10-CM | POA: Diagnosis not present

## 2024-01-12 DIAGNOSIS — N529 Male erectile dysfunction, unspecified: Secondary | ICD-10-CM | POA: Diagnosis not present

## 2024-01-12 DIAGNOSIS — I10 Essential (primary) hypertension: Secondary | ICD-10-CM | POA: Diagnosis not present

## 2024-01-12 DIAGNOSIS — L719 Rosacea, unspecified: Secondary | ICD-10-CM | POA: Diagnosis not present

## 2024-01-12 DIAGNOSIS — F900 Attention-deficit hyperactivity disorder, predominantly inattentive type: Secondary | ICD-10-CM | POA: Diagnosis not present

## 2024-01-12 DIAGNOSIS — Z1211 Encounter for screening for malignant neoplasm of colon: Secondary | ICD-10-CM | POA: Diagnosis not present

## 2024-01-12 DIAGNOSIS — R110 Nausea: Secondary | ICD-10-CM | POA: Diagnosis not present

## 2024-01-12 DIAGNOSIS — G4700 Insomnia, unspecified: Secondary | ICD-10-CM | POA: Diagnosis not present

## 2024-02-29 DIAGNOSIS — F900 Attention-deficit hyperactivity disorder, predominantly inattentive type: Secondary | ICD-10-CM | POA: Diagnosis not present

## 2024-02-29 DIAGNOSIS — L719 Rosacea, unspecified: Secondary | ICD-10-CM | POA: Diagnosis not present

## 2024-02-29 DIAGNOSIS — I10 Essential (primary) hypertension: Secondary | ICD-10-CM | POA: Diagnosis not present

## 2024-02-29 DIAGNOSIS — E1165 Type 2 diabetes mellitus with hyperglycemia: Secondary | ICD-10-CM | POA: Diagnosis not present

## 2024-02-29 DIAGNOSIS — E663 Overweight: Secondary | ICD-10-CM | POA: Diagnosis not present

## 2024-02-29 DIAGNOSIS — R972 Elevated prostate specific antigen [PSA]: Secondary | ICD-10-CM | POA: Diagnosis not present

## 2024-02-29 DIAGNOSIS — K219 Gastro-esophageal reflux disease without esophagitis: Secondary | ICD-10-CM | POA: Diagnosis not present

## 2024-08-01 DIAGNOSIS — Z1211 Encounter for screening for malignant neoplasm of colon: Secondary | ICD-10-CM | POA: Diagnosis not present

## 2024-08-01 DIAGNOSIS — Z1212 Encounter for screening for malignant neoplasm of rectum: Secondary | ICD-10-CM | POA: Diagnosis not present

## 2024-08-08 LAB — COLOGUARD: COLOGUARD: NEGATIVE

## 2025-05-08 ENCOUNTER — Ambulatory Visit: Admitting: Dermatology
